# Patient Record
Sex: Female | Born: 1950 | Race: White
Health system: Midwestern US, Community
[De-identification: ages and names within clinical notes are randomized; demographics above are authoritative.]

## PROBLEM LIST (undated history)

## (undated) DIAGNOSIS — I959 Hypotension, unspecified: Secondary | ICD-10-CM

## (undated) DIAGNOSIS — F329 Major depressive disorder, single episode, unspecified: Secondary | ICD-10-CM

## (undated) DIAGNOSIS — D649 Anemia, unspecified: Secondary | ICD-10-CM

## (undated) DIAGNOSIS — M419 Scoliosis, unspecified: Secondary | ICD-10-CM

## (undated) DIAGNOSIS — F32A Depression, unspecified: Secondary | ICD-10-CM

## (undated) DIAGNOSIS — W19XXXA Unspecified fall, initial encounter: Secondary | ICD-10-CM

## (undated) DIAGNOSIS — M545 Low back pain, unspecified: Secondary | ICD-10-CM

## (undated) DIAGNOSIS — Z5189 Encounter for other specified aftercare: Secondary | ICD-10-CM

## (undated) HISTORY — PX: OTHER SURGICAL HISTORY: SHX169

## (undated) HISTORY — PX: TUBAL LIGATION: SHX77

## (undated) HISTORY — PX: ABLATION ON ENDOMETRIOSIS: SHX5787

## (undated) HISTORY — DX: Encounter for other specified aftercare: Z51.89

## (undated) HISTORY — PX: COLONOSCOPY: SHX174

## (undated) HISTORY — DX: Anemia, unspecified: D64.9

## (undated) HISTORY — DX: Major depressive disorder, single episode, unspecified: F32.9

## (undated) HISTORY — DX: Depression, unspecified: F32.A

## (undated) HISTORY — DX: Hypotension, unspecified: I95.9

## (undated) HISTORY — PX: WISDOM TOOTH EXTRACTION: SHX21

---

## 2001-09-27 LAB — HM DEXA SCAN: HM Dexa Scan: NORMAL

## 2003-08-28 LAB — HM COLONOSCOPY

## 2003-09-26 ENCOUNTER — Ambulatory Visit (HOSPITAL_COMMUNITY): Admission: RE | Admit: 2003-09-26 | Discharge: 2003-09-26 | Payer: Self-pay | Admitting: Gastroenterology

## 2004-06-24 ENCOUNTER — Other Ambulatory Visit: Payer: Self-pay

## 2006-02-03 ENCOUNTER — Ambulatory Visit: Payer: Self-pay | Admitting: Family Medicine

## 2009-01-17 ENCOUNTER — Ambulatory Visit: Payer: Self-pay | Admitting: Family Medicine

## 2009-01-17 DIAGNOSIS — R42 Dizziness and giddiness: Secondary | ICD-10-CM | POA: Insufficient documentation

## 2009-03-12 ENCOUNTER — Telehealth: Payer: Self-pay | Admitting: Family Medicine

## 2011-03-14 ENCOUNTER — Other Ambulatory Visit: Payer: Self-pay | Admitting: Family Medicine

## 2011-03-14 MED ORDER — CIPROFLOXACIN HCL 500 MG PO TABS: 500.0000 mg | ORAL_TABLET | Freq: Two times a day (BID) | ORAL | Status: AC

## 2011-08-06 ENCOUNTER — Ambulatory Visit (INDEPENDENT_AMBULATORY_CARE_PROVIDER_SITE_OTHER): Payer: PRIVATE HEALTH INSURANCE | Admitting: Family Medicine

## 2011-08-06 ENCOUNTER — Encounter: Payer: Self-pay | Admitting: Family Medicine

## 2011-08-06 VITALS — BP 102/70 | HR 81 | Temp 98.2°F | Wt 134.2 lb

## 2011-08-06 DIAGNOSIS — J069 Acute upper respiratory infection, unspecified: Secondary | ICD-10-CM

## 2011-08-06 DIAGNOSIS — J029 Acute pharyngitis, unspecified: Secondary | ICD-10-CM

## 2011-08-06 LAB — POCT RAPID STREP A (OFFICE): Rapid Strep A Screen: NEGATIVE

## 2011-08-06 MED ORDER — AZITHROMYCIN 250 MG PO TABS: ORAL_TABLET | ORAL | Status: AC

## 2011-08-06 NOTE — Patient Instructions (Addendum)
Drink plenty of fluids, take tylenol/dayquil as needed, and gargle with warm salt water for your throat.  This should gradually improve.  Take care.  Let us know if you have other concerns.  I think the cough will get better last.  Start the antibiotics if you are aren't better next week.

## 2011-08-06 NOTE — Progress Notes (Signed)
duration of symptoms: 2 weeks ago, was starting to get better after a few days. Had lingering cough.  Worse again since last night.  New/recent sx: Rhinorrhea: yes Congestion:yes ear pain:yes, B sore throat: yes Cough:same as prev Myalgias: yes No sick contacts  ROS: See HPI.  Otherwise negative.    Meds, vitals, and allergies reviewed.   GEN: nad, alert and oriented HEENT: mucous membranes moist, TM w/o erythema, nasal epithelium injected, OP with cobblestoning, op with erythema, max sinus ttp B NECK: supple w/o LA CV: rrr. PULM: ctab, no inc wob ABD: soft, +bs EXT: no edema RST neg

## 2011-08-08 ENCOUNTER — Encounter: Payer: Self-pay | Admitting: Family Medicine

## 2011-08-08 DIAGNOSIS — J069 Acute upper respiratory infection, unspecified: Secondary | ICD-10-CM | POA: Insufficient documentation

## 2011-08-08 NOTE — Assessment & Plan Note (Signed)
Nontoxic, likely with prev viral/resolving illness that had prolonged cough associated with it.  Now with second illness.  I would give this a few days to resolve and if not improved, start abx at that point given her exam.  She agreed.  ddx d/w pt.  Supportive tx in meantime.

## 2012-01-10 ENCOUNTER — Ambulatory Visit: Payer: PRIVATE HEALTH INSURANCE | Admitting: Family Medicine

## 2012-01-14 ENCOUNTER — Ambulatory Visit (INDEPENDENT_AMBULATORY_CARE_PROVIDER_SITE_OTHER): Payer: PRIVATE HEALTH INSURANCE | Admitting: Family Medicine

## 2012-01-14 ENCOUNTER — Encounter: Payer: Self-pay | Admitting: Family Medicine

## 2012-01-14 VITALS — BP 110/64 | HR 86 | Temp 97.8°F | Ht 66.0 in | Wt 130.0 lb

## 2012-01-14 DIAGNOSIS — R490 Dysphonia: Secondary | ICD-10-CM

## 2012-01-14 DIAGNOSIS — H9209 Otalgia, unspecified ear: Secondary | ICD-10-CM

## 2012-01-14 DIAGNOSIS — R42 Dizziness and giddiness: Secondary | ICD-10-CM

## 2012-01-14 NOTE — Assessment & Plan Note (Signed)
Nl exam  Some vertigo at night  Will try antihistamine to see if it helps  Ref to ENT

## 2012-01-14 NOTE — Patient Instructions (Signed)
You can try an antihistamine daily like zyrtec or claritin We will refer you to ENT at check out  Update if suddenly worse or fever or other symptoms

## 2012-01-14 NOTE — Progress Notes (Signed)
Subjective:    Patient ID: Christie Lin, female    DOB: September 23, 1951, 61 y.o.   MRN: [id number]  HPI Is here for hoarseness - is intermittent - not constant  Also her L ear hurts occasionally  Also some dizziness at night - rolling over in bed   ? 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 VERTIGO  . URI (upper respiratory infection)  . Hoarseness  . Ear discomfort   Past Medical History  Diagnosis Date  . Endometriosis   . Depression     grief reaction   No past surgical history on file. History  Substance Use Topics  . Smoking status: Never Smoker   . Smokeless tobacco: Not on file  . Alcohol Use: Not on file   Family History  Problem Relation Age of Onset  . Depression Sister   . Cancer Maternal Grandmother     ovarian   Allergies  Allergen Reactions  . Penicillins    Current Outpatient Prescriptions on File Prior to Visit  Medication Sig Dispense Refill  . sertraline (ZOLOFT) 25 MG tablet Take 25 mg by mouth every other day.          Review of Systems Review of Systems  Constitutional: Negative for fever, appetite change, fatigue and unexpected weight change.  Eyes: Negative for pain and visual disturbance.  ENT pos for ear discomfort and hoarse voice without st or congestion  Respiratory: Negative for cough and shortness of breath.   Cardiovascular: Negative for cp or palpitations    Gastrointestinal: Negative for nausea, diarrhea and constipation.  Genitourinary: Negative for urgency and frequency.  Skin: Negative for pallor or rash   Neurological: Negative for weakness, , numbness and headaches. pos for  dizziness Hematological: Negative for adenopathy. Does not bruise/bleed easily.  Psychiatric/Behavioral: Negative for dysphoric mood. The patient is not nervous/anxious.          Objective:   Physical Exam  Constitutional: She appears well-developed and well-nourished. No distress.  HENT:  Head: Normocephalic and atraumatic.  Right Ear: External ear normal.  Left Ear: External ear normal.  Nose: Nose normal.  Mouth/Throat: Oropharynx is clear and moist. No oropharyngeal exudate.       No sinus tenderness Hearing nl   Neck: Normal range of motion. Neck supple. No JVD present. Carotid bruit is not present. No tracheal deviation present. No thyromegaly present.  Cardiovascular: Normal rate, regular rhythm, normal heart sounds and intact distal pulses.  Exam reveals no gallop.   Pulmonary/Chest: Effort normal and breath sounds normal. No respiratory distress. She has no wheezes.  Abdominal: Soft. Bowel sounds are normal. She exhibits no abdominal bruit. There is no tenderness.  Lymphadenopathy:    She has no cervical adenopathy.  Neurological: She is alert. She has normal reflexes. No cranial nerve deficit. She exhibits normal muscle tone. Coordination normal.  Skin: Skin is warm and dry. No rash noted. No erythema. No pallor.  Psychiatric: She has a normal mood and affect.  Assessment & Plan:

## 2012-01-14 NOTE — Assessment & Plan Note (Signed)
Worse lately- esp at night  Assoc with L ear discomfort  Not a lot of allergy symptoms Ref to ENT for eval

## 2012-01-14 NOTE — Assessment & Plan Note (Signed)
Intermittent since dec  No congestion or gerd symptoms Some ear discomfort Will ref to ENT for further eval

## 2012-04-20 ENCOUNTER — Ambulatory Visit (INDEPENDENT_AMBULATORY_CARE_PROVIDER_SITE_OTHER): Payer: PRIVATE HEALTH INSURANCE | Admitting: Family Medicine

## 2012-04-20 ENCOUNTER — Encounter: Payer: Self-pay | Admitting: Family Medicine

## 2012-04-20 ENCOUNTER — Telehealth: Payer: Self-pay | Admitting: Family Medicine

## 2012-04-20 VITALS — BP 113/78 | HR 63 | Temp 97.9°F | Ht 66.0 in | Wt 123.5 lb

## 2012-04-20 DIAGNOSIS — R55 Syncope and collapse: Secondary | ICD-10-CM

## 2012-04-20 NOTE — Progress Notes (Signed)
Nature conservation officer at [facility] [address] [place] [address] Phone: [phone] Fax: [phone]  Date:  04/20/2012   Name:  Christie Lin   DOB:  1951-02-04   MRN:  [id number]  PCP:  Roxy Manns, MD    Chief Complaint: Loss of Consciousness   History of Present Illness:  Christie Lin is a 62 y.o. very pleasant female patient who presents with the following:  Was standing in the parking lot, then not feeling really well. Changed her leg position and changed her leg position and leaned against the car, then felt nauseous. Felt like going to pass out. Police came and caught as fell. Did not fully black out. History is significant for not eating well, and losing 10 pounds during her recent trip to [place].  Twice in a half hour. About 1:30.   Wt Readings from Last 3 Encounters:  04/20/12 123 lb 8 oz (56.019 kg)  01/14/12 130 lb 0.6 oz (58.986 kg)  08/06/11 134 lb 4 oz (60.895 kg)   Took some ABX in [place].   Last week. Wants to sleep. Ate while in [place], but not eaten since back.  Sometimes will have ? Palpitations.   Took some chinese herbal medicine while there while sick. A lot of herbs. Last week. 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   Had a cup of cofee this morning, then a bagel, then pass out. And a handful of almonds.  Yesterday, had two pimento cheese sandwiches, peach, yogurt.     Past Medical History, Surgical History, Social History, Family History, Problem List, Medications, and Allergies have been reviewed and updated if relevant.  Current Outpatient Prescriptions on File Prior to Visit  Medication Sig Dispense Refill  . sertraline (ZOLOFT) 25 MG tablet Take 25 mg by mouth every other day.        Review of Systems: As above. No fever chills or sweats. No true syncope. Presyncopal event x2. No shortness of breath  Physical Examination: Filed Vitals:   04/20/12 1553  BP: 110/68  Pulse: 63  Temp: 97.9 F (36.6 C)   Filed Vitals:   04/20/12 1553  Height: 5\' 6"  (1.676 m)  Weight: 123 lb 8 oz (56.019 kg)   Body mass index is 19.93 kg/(m^2). Ideal Body Weight: Weight in (lb) to have BMI = 25: 154.6    GEN: WDWN, NAD, Non-toxic, A & O x 3 HEENT: Atraumatic, Normocephalic. Neck supple. No masses, No LAD. Ears and Nose: No external deformity. CV: RRR, No M/G/R. No JVD. No thrill. No extra heart sounds. PULM: CTA B, no wheezes, crackles, rhonchi. No retractions. No resp. distress. No accessory muscle use. EXTR: No c/c/e NEURO Normal gait.  PSYCH: Normally interactive. Conversant. Not depressed or anxious appearing.  Calm demeanor.    Assessment and Plan:  1. Fainting  EKG 12-Lead, Basic metabolic panel, CBC with Differential, TSH, Hepatic function panel   EKG: Normal sinus rhythm. Normal axis, normal R wave progression, No acute ST elevation or depression.  The patient's blood pressure dropped significantly, and was actually unable to be registered when she was initially laid flat. I strongly suspect that this is orthostatic or vasovagal, and likely suspect that her recent 10 pound weight loss and probable dehydration was playing a role. I suggested that they make sure that she eat 3 meals a day along with some snacking with some additional salt or Gatorade to help with volume. If symptoms continue, further investigation is warranted.  Orders Today:  Orders Placed This Encounter  Procedures  . Basic metabolic panel  . CBC with Differential  . TSH  . Hepatic function panel  . EKG 12-Lead    Medications Today: (Includes new updates added during medication reconciliation) No orders of the defined types were placed in this encounter.     Results for orders placed in visit on 04/20/12  BASIC METABOLIC PANEL      Component Value Range   Sodium 140  135 - 145 mEq/L   Potassium 4.8  3.5 - 5.1 mEq/L   Chloride 103  96 - 112 mEq/L   CO2 28  19 - 32 mEq/L   Glucose, Bld 94  70 - 99 mg/dL   BUN 17  6 - 23 mg/dL    Creatinine, Ser 0.9  0.4 - 1.2 mg/dL   Calcium 9.9  8.4 - 02.7 mg/dL   GFR 25.36  >64.40 mL/min  CBC WITH DIFFERENTIAL      Component Value Range   WBC 5.9  4.5 - 10.5 K/uL   RBC 4.57  3.87 - 5.11 Mil/uL   Hemoglobin 14.2  12.0 - 15.0 g/dL   HCT 34.7  42.5 - 95.6 %   MCV 92.4  78.0 - 100.0 fl   MCHC 33.7  30.0 - 36.0 g/dL   RDW 38.7  56.4 - 33.2 %   Platelets 214.0  150.0 - 400.0 K/uL   Neutrophils Relative 68.5  43.0 - 77.0 %   Lymphocytes Relative 23.9  12.0 - 46.0 %   Monocytes Relative 5.8  3.0 - 12.0 %   Eosinophils Relative 1.6  0.0 - 5.0 %   Basophils Relative 0.2  0.0 - 3.0 %   Neutro Abs 4.0  1.4 - 7.7 K/uL   Lymphs Abs 1.4  0.7 - 4.0 K/uL   Monocytes Absolute 0.3  0.1 - 1.0 K/uL   Eosinophils Absolute 0.1  0.0 - 0.7 K/uL   Basophils Absolute 0.0  0.0 - 0.1 K/uL  TSH      Component Value Range   TSH 0.46  0.35 - 5.50 uIU/mL  HEPATIC FUNCTION PANEL      Component Value Range   Total Bilirubin 0.6  0.3 - 1.2 mg/dL   Bilirubin, Direct 0.1  0.0 - 0.3 mg/dL   Alkaline Phosphatase 52  39 - 117 U/L   AST 22  0 - 37 U/L   ALT 14  0 - 35 U/L   Total Protein 7.5  6.0 - 8.3 g/dL   Albumin 4.4  3.5 - 5.2 g/dL     Hannah Beat, MD

## 2012-04-20 NOTE — Telephone Encounter (Signed)
Caller: Jennifer/Child; PCP: Tower, Marne A.; CB#: [phone]; ; ; Call regarding Syncope; x 2 while standing in a parking lot.  Pt.s fall was broken by a bystander.  She stood up, and felt dizzy again, and fell a second time, and a bystander broke her fall again.  Pt. was on a 28 hour plane ride back from [place] yesterday.  Pt. is presently A/O x 3 with no neuro deficits.  Triaged with Fainting and emergent sx. r/o with exception to:  Fainting sx. without any warning sx.  Disposition:  See Provider within 4 hours.  Appt. made for 3:30 pm today.  Care instructions given.  CAN/DB

## 2012-04-21 LAB — CBC WITH DIFFERENTIAL/PLATELET
Basophils Absolute: 0 10*3/uL (ref 0.0–0.1)
Basophils Relative: 0.2 % (ref 0.0–3.0)
Eosinophils Absolute: 0.1 10*3/uL (ref 0.0–0.7)
Eosinophils Relative: 1.6 % (ref 0.0–5.0)
HCT: 42.2 % (ref 36.0–46.0)
Hemoglobin: 14.2 g/dL (ref 12.0–15.0)
Lymphocytes Relative: 23.9 % (ref 12.0–46.0)
Lymphs Abs: 1.4 10*3/uL (ref 0.7–4.0)
MCHC: 33.7 g/dL (ref 30.0–36.0)
MCV: 92.4 fl (ref 78.0–100.0)
Monocytes Absolute: 0.3 10*3/uL (ref 0.1–1.0)
Monocytes Relative: 5.8 % (ref 3.0–12.0)
Neutro Abs: 4 10*3/uL (ref 1.4–7.7)
Neutrophils Relative %: 68.5 % (ref 43.0–77.0)
Platelets: 214 10*3/uL (ref 150.0–400.0)
RBC: 4.57 Mil/uL (ref 3.87–5.11)
RDW: 13.5 % (ref 11.5–14.6)
WBC: 5.9 10*3/uL (ref 4.5–10.5)

## 2012-04-21 LAB — BASIC METABOLIC PANEL
BUN: 17 mg/dL (ref 6–23)
CO2: 28 mEq/L (ref 19–32)
Calcium: 9.9 mg/dL (ref 8.4–10.5)
Chloride: 103 mEq/L (ref 96–112)
Creatinine, Ser: 0.9 mg/dL (ref 0.4–1.2)
GFR: 67.9 mL/min (ref >60.00)
Glucose, Bld: 94 mg/dL (ref 70–99)
Potassium: 4.8 mEq/L (ref 3.5–5.1)
Sodium: 140 mEq/L (ref 135–145)

## 2012-04-21 LAB — HEPATIC FUNCTION PANEL
ALT: 14 U/L (ref 0–35)
AST: 22 U/L (ref 0–37)
Albumin: 4.4 g/dL (ref 3.5–5.2)
Alkaline Phosphatase: 52 U/L (ref 39–117)
Bilirubin, Direct: 0.1 mg/dL (ref 0.0–0.3)
Total Bilirubin: 0.6 mg/dL (ref 0.3–1.2)
Total Protein: 7.5 g/dL (ref 6.0–8.3)

## 2012-04-21 LAB — TSH: TSH: 0.46 u[IU]/mL (ref 0.35–5.50)

## 2012-04-24 ENCOUNTER — Encounter: Payer: Self-pay | Admitting: *Deleted

## 2012-06-28 ENCOUNTER — Telehealth: Payer: Self-pay | Admitting: Family Medicine

## 2012-06-28 NOTE — Telephone Encounter (Signed)
Advise pt that Dr. Karie Schwalbe recommends to go to urgent care to be evaluated and pt agreed and said she is going to go to an urgent care

## 2012-06-28 NOTE — Telephone Encounter (Signed)
Since I am getting this after 5 pm - I think she needs to go to an urgent care to be seen now

## 2012-06-28 NOTE — Telephone Encounter (Signed)
°  Caller: [name]/Patient; Patient Name: Christie Lin; PCP: Roxy Manns [facility]); Best Callback Phone Number: [phone].  She has UTI symptoms burning, frequency and starting to see "pink"  on the tissue.  Symptoms started 06/27/12.  Triaged Bloody Urine and Pt passed a what looked like a long "string of Lin".  Needs to be seen in 4 hours for passing Lin clots with urination.   No appts seen in [facility].  Home care and call back instructions given.  Please call pateint to advise.

## 2012-09-08 ENCOUNTER — Ambulatory Visit (INDEPENDENT_AMBULATORY_CARE_PROVIDER_SITE_OTHER): Payer: PRIVATE HEALTH INSURANCE | Admitting: Family Medicine

## 2012-09-08 ENCOUNTER — Encounter: Payer: Self-pay | Admitting: Family Medicine

## 2012-09-08 VITALS — BP 112/70 | HR 69 | Temp 98.0°F | Wt 129.0 lb

## 2012-09-08 DIAGNOSIS — S239XXA Sprain of unspecified parts of thorax, initial encounter: Secondary | ICD-10-CM | POA: Insufficient documentation

## 2012-09-08 MED ORDER — NAPROXEN 500 MG PO TABS: ORAL_TABLET | ORAL | Status: DC

## 2012-09-08 MED ORDER — CYCLOBENZAPRINE HCL 5 MG PO TABS: 5.0000 mg | ORAL_TABLET | Freq: Three times a day (TID) | ORAL | Status: DC | PRN

## 2012-09-08 NOTE — Progress Notes (Signed)
  Subjective:    Patient ID: Christie Lin, female    DOB: 25-Jan-1951, 61 y.o.   MRN: [id number]  HPI CC: "my back has been hurting for 6-8 wks"  6-8 wk h/o lower thoracic back pain that radiates into ribcage.  L>R.  Getting worse.  Now with muscle spasms that take breath away.  Yesterday pain started radiating to L hip.  Has ben carrying 2 grandchildren (43mo and 3yo) recently (not more than normal).  Last few days with mild nausea.  Denies inciting trauma/falls/injury.    Today took 2 aspirin and 3 advil.  Has to leave when shopping 2/2 back pain.  Denies neck pain, lower back pain.  No shooting pain down legs.  No parethesias down legs.  No bowel/bladder accidents.  No fevers/chills.   Denies dysuria, frequency.  Chronic urgency.  No blood in stool or urine.  No weight changes.  No abd pain. [employer] broadcasting/film/video.  Past Medical History  Diagnosis Date  . Endometriosis   . Depression     grief reaction    Review of Systems Per HPI    Objective:   Physical Exam  Nursing note and vitals reviewed. Constitutional: She appears well-developed and well-nourished. No distress.  Musculoskeletal:       No significant scoliosis No midline thoracic or lumbar spine tenderness + thoracic paraspinous mm tightness evident on left but not significantly tender. Neg SLR bilaterally Neg FABER test No pain at SIJ, GTB or sciatic notch bilaterally FROM at shoulders and neck.  Neurological: She has normal strength. No sensory deficit. She displays a negative Romberg sign.       5/5 BLE       Assessment & Plan:

## 2012-09-08 NOTE — Patient Instructions (Signed)
You have thoracic strain. Treat with flexeril 5mg  2-3 times daily as needed - may make you sleepy - and take naprosyn twice daily with food for 5 days then as needed. Do stretching exercises provided today. If not improving as expected, or any worsening, let us know.

## 2012-09-08 NOTE — Assessment & Plan Note (Signed)
No red flags. Treat with NSAID and flexeril scheduled for 3-5 days.  Discussed sedation precautions with flexeril, sent in lower dose. rec do stretching exercises provided from [facility] pt advisor on upper back pain. Pt agrees with plan.

## 2012-09-25 ENCOUNTER — Encounter: Payer: Self-pay | Admitting: Family Medicine

## 2012-09-25 ENCOUNTER — Ambulatory Visit (INDEPENDENT_AMBULATORY_CARE_PROVIDER_SITE_OTHER): Payer: PRIVATE HEALTH INSURANCE | Admitting: Family Medicine

## 2012-09-25 VITALS — BP 100/70 | HR 82 | Temp 98.4°F | Ht 66.0 in | Wt 133.8 lb

## 2012-09-25 DIAGNOSIS — I959 Hypotension, unspecified: Secondary | ICD-10-CM | POA: Insufficient documentation

## 2012-09-25 DIAGNOSIS — R55 Syncope and collapse: Secondary | ICD-10-CM | POA: Insufficient documentation

## 2012-09-25 NOTE — Progress Notes (Signed)
Subjective:    Patient ID: Christie Lin, female    DOB: [birth year], 61 y.o.   MRN: [id number]  HPI Here with issues re: hypotension  Was here in July - with syncope times 2 -- thought to be vasovagal  Has happened 6 more times Tends to have low bp Friday- felt episode coming on -- was getting coffee in am -- and then husb helped her into a chair- she fell to floor and went limp No seizure activity but was sweating a lot  Had to lie on the floor Left her so weak- had to go to bed for 2-3 hours  Took her bp 79/48  Very pale  Pulse was 96   Last labs were all ok as well , and EKG nl  Is good about getting enough salt and water in diet  Not an exerciser regularly   No one in family with these symptoms   No new medicines  Is on zoloft  2 weeks ago came in for back pain  If she takes flexeril is in the eve   Patient Active Problem List  Diagnosis  . VERTIGO  . Thoracic sprain   Past Medical History  Diagnosis Date  . Endometriosis   . Depression     grief reaction   No past surgical history on file. History  Substance Use Topics  . Smoking status: Never Smoker   . Smokeless tobacco: Not on file  . Alcohol Use: No   Family History  Problem Relation Age of Onset  . Depression Sister   . Cancer Maternal Grandmother     ovarian   Allergies  Allergen Reactions  . Penicillins    Current Outpatient Prescriptions on File Prior to Visit  Medication Sig Dispense Refill  . cyclobenzaprine (FLEXERIL) 5 MG tablet Take 1 tablet (5 mg total) by mouth 3 (three) times daily as needed for muscle spasms.  40 tablet  1  . naproxen (NAPROSYN) 500 MG tablet Take one po bid x 1 week then prn pain, take with food  60 tablet  0  . sertraline (ZOLOFT) 25 MG tablet Take 25 mg by mouth every other day.           Review of Systems Review of Systems  Constitutional: Negative for fever, appetite change,  and unexpected weight change. pos for fatigue after syncopal episode Eyes:  Negative for pain and visual disturbance.  Respiratory: Negative for cough and shortness of breath.   Cardiovascular: Negative for cp or palpitations    Gastrointestinal: Negative for nausea, diarrhea and constipation.  Genitourinary: Negative for urgency and frequency.  Skin: Negative for pallor or rash   Neurological: Negative for weakness, light-headedness, numbness and headaches. pos for syncope , neg for seizure activity Hematological: Negative for adenopathy. Does not bruise/bleed easily.  Psychiatric/Behavioral: Negative for dysphoric mood. The patient is not nervous/anxious.         Objective:   Physical Exam  Constitutional: She is oriented to person, place, and time. She appears well-developed and well-nourished. No distress.  HENT:  Head: Normocephalic and atraumatic.  Right Ear: External ear normal.  Left Ear: External ear normal.  Mouth/Throat: Oropharynx is clear and moist.  Eyes: Conjunctivae normal and EOM are normal. Pupils are equal, round, and reactive to light. Right eye exhibits no discharge. Left eye exhibits no discharge. No scleral icterus.  Neck: Normal range of motion. Neck supple. No JVD present. Carotid bruit is not present. No tracheal deviation present. No thyromegaly present.  Cardiovascular: Normal rate, regular rhythm, normal heart sounds and intact distal pulses.  Exam reveals no gallop.   Pulmonary/Chest: Breath sounds normal. No respiratory distress. She has no wheezes. She has no rales.  Abdominal: Soft. Bowel sounds are normal. She exhibits no distension, no abdominal bruit and no mass. There is no tenderness.  Musculoskeletal: She exhibits no edema and no tenderness.  Lymphadenopathy:    She has no cervical adenopathy.  Neurological: She is alert and oriented to person, place, and time. She has normal reflexes. She displays no atrophy and no tremor. No cranial nerve deficit or sensory deficit. She exhibits normal muscle tone. She displays no seizure  activity. Coordination and gait normal.       No focal cerebellar signs  Skin: Skin is warm and dry. No rash noted. No erythema. No pallor.  Psychiatric: She has a normal mood and affect.          Assessment & Plan:

## 2012-09-25 NOTE — Patient Instructions (Addendum)
Continue trying to get enough fluids and salt and do not skip meals If you feel faint- always lie down - as you have been doing We will refer you to cardiology at check out  Alert me if any new symptoms in the meantime

## 2012-09-25 NOTE — Assessment & Plan Note (Signed)
Recurrent with vasovagal symptoms and likely orthostatic hypotension Rev lab and EKG from summer  Ref to cardiology for further eval Disc safety She will continue eating salt and drinking fluids

## 2012-09-25 NOTE — Assessment & Plan Note (Signed)
See assessment for syncope Ref to cardiol

## 2012-10-12 ENCOUNTER — Ambulatory Visit (INDEPENDENT_AMBULATORY_CARE_PROVIDER_SITE_OTHER): Payer: PRIVATE HEALTH INSURANCE | Admitting: Cardiovascular Disease

## 2012-10-12 ENCOUNTER — Encounter: Payer: Self-pay | Admitting: Cardiovascular Disease

## 2012-10-12 VITALS — BP 102/68 | HR 98 | Ht 66.0 in | Wt 128.5 lb

## 2012-10-12 DIAGNOSIS — R55 Syncope and collapse: Secondary | ICD-10-CM | POA: Insufficient documentation

## 2012-10-12 DIAGNOSIS — R000 Tachycardia, unspecified: Secondary | ICD-10-CM

## 2012-10-12 DIAGNOSIS — R42 Dizziness and giddiness: Secondary | ICD-10-CM

## 2012-10-12 MED ORDER — FLUDROCORTISONE ACETATE 0.1 MG PO TABS: 0.1000 mg | ORAL_TABLET | Freq: Every day | ORAL | Status: DC

## 2012-10-12 MED ORDER — MIDODRINE HCL 5 MG PO TABS: 5.0000 mg | ORAL_TABLET | Freq: Three times a day (TID) | ORAL | Status: DC | PRN

## 2012-10-12 NOTE — Progress Notes (Signed)
Patient ID: Christie Lin, female    DOB: [birth year], 62 y.o.   MRN: [id number]  HPI Comments: Christie Lin is a very pleasant 62-year-old woman, patient of Dr. Milinda Antis who presents by referral for evaluation of near syncope he.  Reports that she went to [place] over the summer, returned in July 2013. Following her return, she felt unwell, and significant jet lag. 10 days later, she had her first episode of dizziness when standing. She felt weak, extremely dizzy. She had to lie down on the ground and felt "out of it". Since then she has had frequent episodes of near syncope. She had 2 on that day, numerous subsequent episodes. Documentation of each episode shows low blood pressure sometimes in the 70s over 40s with normal heart rate in the 80s to 90s. Last episode was several days ago, also episode on January 3. Now episodes are happening once a week.  Lab work done in July on her first episode was essentially normal. No dehydration. She did lose 10 pounds while she was in [place]. She has gained only several pounds back. She likes the way she feels and does not want to have to gain significant weight back if not needed. She is otherwise very active with no complaints of shortness of breath, chest pain, edema. Symptoms of lightheadedness dizziness, near syncope always happen when she is standing, sometimes for prolonged period of time.  EKG shows normal sinus rhythm with rate 80 beats per minute with no significant ST or T wave changes    Outpatient Encounter Prescriptions as of 10/12/2012  Medication Sig Dispense Refill  . citalopram (CELEXA) 20 MG tablet Take 20 mg by mouth daily.      . cyclobenzaprine (FLEXERIL) 5 MG tablet Take 1 tablet (5 mg total) by mouth 3 (three) times daily as needed for muscle spasms.  40 tablet  1  . naproxen (NAPROSYN) 500 MG tablet Take one po bid x 1 week then prn pain, take with food  60 tablet  0    Review of Systems  Constitutional: Negative.   HENT: Negative.     Eyes: Negative.   Respiratory: Negative.   Cardiovascular: Negative.   Gastrointestinal: Negative.   Musculoskeletal: Negative.   Skin: Negative.   Neurological: Positive for dizziness.       Near syncope with no loss of consciousness  Hematological: Negative.   Psychiatric/Behavioral: Negative.   All other systems reviewed and are negative.     BP 102/68  Pulse 98  Ht 5\' 6"  (1.676 m)  Wt 128 lb 8 oz (58.287 kg)  BMI 20.74 kg/m2  Physical Exam  Nursing note and vitals reviewed. Constitutional: She is oriented to person, place, and time. She appears well-developed.       Very thin  HENT:  Head: Normocephalic.  Nose: Nose normal.  Mouth/Throat: Oropharynx is clear and moist.  Eyes: Conjunctivae normal are normal. Pupils are equal, round, and reactive to light.  Neck: Normal range of motion. Neck supple. No JVD present.  Cardiovascular: Normal rate, regular rhythm, S1 normal, S2 normal, normal heart sounds and intact distal pulses.  Exam reveals no gallop and no friction rub.   No murmur heard. Pulmonary/Chest: Effort normal and breath sounds normal. No respiratory distress. She has no wheezes. She has no rales. She exhibits no tenderness.  Abdominal: Soft. Bowel sounds are normal. She exhibits no distension. There is no tenderness.  Musculoskeletal: Normal range of motion. She exhibits no edema and no tenderness.  Lymphadenopathy:  She has no cervical adenopathy.  Neurological: She is alert and oriented to person, place, and time. Coordination normal.  Skin: Skin is warm and dry. No rash noted. No erythema.  Psychiatric: She has a normal mood and affect. Her behavior is normal. Judgment and thought content normal.         Assessment and Plan

## 2012-10-12 NOTE — Patient Instructions (Addendum)
Please increase salt intake and fluid intake Consider starting the florinef 0.1 mg every other day Use midodrine as needed to start for symptomatic dizziness  Consider using compression hose  Please call us if you have new issues that need to be addressed before your next appt.

## 2012-10-12 NOTE — Assessment & Plan Note (Signed)
Numerous episodes of near syncope, lightheadedness, dizziness with orthostatic hypotension, documented low blood pressures. Heart rate is typically in the 80s to 90s. Does not seem to be a POTS phenomenon. More likely very low body weight contributing to baseline low blood pressure with periodic drops in her blood pressure when standing.  We have encouraged significant fluid intake, salt intake, possibly wearing compression TED hose. She should lie flat on the ground when she has lightheaded symptoms to avoid injury. We have strongly encouraged she gained several pounds of weight as this would help her blood pressure.   We have prescribed Florinef which she can take 0.1 mg 3 times a week up to every day for symptoms if they persist. Midodrine was also prescribed which she could start taking as needed for symptoms, possibly on a more regular basis daily (up to 3 times a day) as needed if symptoms persist. He would be okay to use both medications. Again I think the best thing would be weight gain which would likely improve her symptoms and minimize the need for medications.  No indication of arrhythmia, clinical exam is essentially benign with no signs of heart failure or cardiomyopathy. If unable to improve her symptoms or if they get worse, would certainly perform an echocardiogram.  If there is any indication of bradycardia or tachycardia during these episodes, event monitor would be indicated. She denies any significant arrhythmia on recent episodes.

## 2013-08-13 ENCOUNTER — Encounter: Payer: Self-pay | Admitting: Family Medicine

## 2013-08-13 ENCOUNTER — Ambulatory Visit (INDEPENDENT_AMBULATORY_CARE_PROVIDER_SITE_OTHER)
Admission: RE | Admit: 2013-08-13 | Discharge: 2013-08-13 | Disposition: A | Discharge status: Home / Self Care | Payer: PRIVATE HEALTH INSURANCE | Source: Ambulatory Visit | Attending: Family Medicine | Admitting: Family Medicine

## 2013-08-13 ENCOUNTER — Ambulatory Visit: Payer: Self-pay | Admitting: Family Medicine

## 2013-08-13 ENCOUNTER — Telehealth: Payer: Self-pay

## 2013-08-13 ENCOUNTER — Ambulatory Visit (INDEPENDENT_AMBULATORY_CARE_PROVIDER_SITE_OTHER): Payer: PRIVATE HEALTH INSURANCE | Admitting: Family Medicine

## 2013-08-13 VITALS — BP 126/62 | HR 66 | Temp 98.4°F | Ht 66.0 in | Wt 130.5 lb

## 2013-08-13 DIAGNOSIS — M25569 Pain in unspecified knee: Secondary | ICD-10-CM

## 2013-08-13 DIAGNOSIS — M546 Pain in thoracic spine: Secondary | ICD-10-CM

## 2013-08-13 DIAGNOSIS — M25561 Pain in right knee: Secondary | ICD-10-CM | POA: Insufficient documentation

## 2013-08-13 DIAGNOSIS — M549 Dorsalgia, unspecified: Secondary | ICD-10-CM | POA: Insufficient documentation

## 2013-08-13 DIAGNOSIS — M7120 Synovial cyst of popliteal space [Baker], unspecified knee: Secondary | ICD-10-CM | POA: Insufficient documentation

## 2013-08-13 MED ORDER — CYCLOBENZAPRINE HCL 5 MG PO TABS: 5.0000 mg | ORAL_TABLET | Freq: Three times a day (TID) | ORAL | Status: DC | PRN

## 2013-08-13 NOTE — Telephone Encounter (Signed)
Dr Milinda Antis said to tell pt it was a Baker's Cyst and pt can leave [place] and will be contacted later with further instruction. [name] voiced understanding and will let pt know.

## 2013-08-13 NOTE — Assessment & Plan Note (Signed)
Mostly posterior  ? Baker's cyst (though it is smaller than it was) Sent for venous duplex of knee Ice/elevation/ aleve prn

## 2013-08-13 NOTE — Progress Notes (Signed)
Subjective:    Patient ID: Christie Lin, female    DOB: 05-21-1951, 62 y.o.   MRN: [id number]  HPI Here for leg/ knee and back pain   Right knee- gets swollen behind it -- today it is a little better  Front of knee hurts a bit also  Deep hip pain occ on that side also -rad down to lower leg occ   Back pain and muscle spasms - mid back radiating to her shoulder blade on R  Has had this before --- had nsaid and muscle relaxer  This comes and goes -when it happens it is really bad  No comfortable position- she tends to want to bend forward- uses heat and cold   No new exercise or trauma   Patient Active Problem List   Diagnosis Date Noted  . Near syncope 10/12/2012  . Syncope 09/25/2012  . Hypotension 09/25/2012  . Thoracic sprain 09/08/2012  . VERTIGO 01/17/2009   Past Medical History  Diagnosis Date  . Endometriosis   . Depression     grief reaction   Past Surgical History  Procedure Laterality Date  . Ruptured ovarian cyst    . Ablation on endometriosis     History  Substance Use Topics  . Smoking status: Never Smoker   . Smokeless tobacco: Not on file  . Alcohol Use: No   Family History  Problem Relation Age of Onset  . Depression Sister   . Cancer Maternal Grandmother     ovarian   Allergies  Allergen Reactions  . Penicillins    Current Outpatient Prescriptions on File Prior to Visit  Medication Sig Dispense Refill  . citalopram (CELEXA) 20 MG tablet Take 20 mg by mouth daily.       No current facility-administered medications on file prior to visit.     Review of Systems Review of Systems  Constitutional: Negative for fever, appetite change, fatigue and unexpected weight change.  Eyes: Negative for pain and visual disturbance.  Respiratory: Negative for cough and shortness of breath.   Cardiovascular: Negative for cp or palpitations    Gastrointestinal: Negative for nausea, diarrhea and constipation.  Genitourinary: Negative for urgency and  frequency.  Skin: Negative for pallor or rash  MSK pos for hip pain and back pain and knee pain / neg for joint swelling   Neurological: Negative for weakness, light-headedness, numbness and headaches.  Hematological: Negative for adenopathy. Does not bruise/bleed easily.  Psychiatric/Behavioral: Negative for dysphoric mood. The patient is not nervous/anxious.         Objective:   Physical Exam  Constitutional: She appears well-developed and well-nourished. No distress.  HENT:  Head: Normocephalic and atraumatic.  Eyes: Conjunctivae and EOM are normal. Pupils are equal, round, and reactive to light.  Neck: Normal range of motion. Neck supple.  Cardiovascular: Normal rate and regular rhythm.   Musculoskeletal: She exhibits edema and tenderness.  TS slt thoracolumbar scoliosis to R with spasm in paraspinal musculature med to R scapula and tenderness Nl rom   Mild R greater trochanteric tenderness  R knee- slt tender over patellar tendon and post knee No joint line tenderness or crepitus or effusion  Nl rom -pain on full flexion Some fullness palp post to knee that is tender   Lymphadenopathy:    She has no cervical adenopathy.  Neurological: She is alert. She has normal reflexes. She displays no atrophy. No cranial nerve deficit or sensory deficit. She exhibits normal muscle tone. Coordination normal.  Skin: Skin is  warm and dry. No rash noted. No erythema.  Psychiatric: She has a normal mood and affect.          Assessment & Plan:

## 2013-08-13 NOTE — Patient Instructions (Signed)
Xray of back today  We will schedule ultrasound of leg at check out  Take 2 aleve with meal twice daily as needed for pain  Flexeril with caution as needed for back spasm

## 2013-08-13 NOTE — Telephone Encounter (Signed)
Pt notified of xray results and Dr. Tower's comments 

## 2013-08-13 NOTE — Telephone Encounter (Signed)
Please let her know when she gets home that this is a baker's cyst as we suspected - next step is ref to ortho to disc tx options-let me know if agreeable

## 2013-08-13 NOTE — Telephone Encounter (Signed)
[name], [name] called report rt leg [place]; negative for DVT; has Baker's Cyst popliteal fossa measuring 4.8x4.3x1.3 cm. Report sent and verbally delivered to Dr Milinda Antis.

## 2013-08-13 NOTE — Telephone Encounter (Signed)
No acute changes on back xray - some scoliosis like I thought - this can produce muscle spasms  Use aleve and flexeril as needed If symptoms worsen- I will refer her for some physical therapy  I will do the ortho referral now

## 2013-08-13 NOTE — Telephone Encounter (Signed)
Pt notified of [place] results, pt does agree with referral to Ortho, I advise [facility]/L[facility] will call to schedule appt, pt wanted to know if we have her xray results back too, please advise

## 2013-08-13 NOTE — Progress Notes (Signed)
Pre-visit discussion using our clinic review tool. No additional management support is needed unless otherwise documented below in the visit note.  

## 2013-08-13 NOTE — Assessment & Plan Note (Signed)
Recurrent Some mild scoliosis and spasm on exam  Xray today- fam hx of mult myeloma and also this is 2nd occurrence  Flexeril prn with caution Heat/ice  Aleve bid with food Will update

## 2013-10-11 ENCOUNTER — Telehealth: Payer: Self-pay

## 2013-10-11 NOTE — Telephone Encounter (Signed)
Pt left v/m requesting flu shot and whooping cough shot. Is it OK to schedule nurse visit?

## 2013-10-11 NOTE — Telephone Encounter (Signed)
appt scheduled for 10/18/13.

## 2013-10-11 NOTE — Telephone Encounter (Signed)
Yes- please check her chart and if she is due that is fine

## 2013-10-18 ENCOUNTER — Ambulatory Visit: Payer: PRIVATE HEALTH INSURANCE

## 2013-11-01 ENCOUNTER — Ambulatory Visit (INDEPENDENT_AMBULATORY_CARE_PROVIDER_SITE_OTHER): Payer: PRIVATE HEALTH INSURANCE | Admitting: *Deleted

## 2013-11-01 DIAGNOSIS — Z23 Encounter for immunization: Secondary | ICD-10-CM

## 2014-11-13 ENCOUNTER — Telehealth: Payer: Self-pay | Admitting: Family Medicine

## 2014-11-13 DIAGNOSIS — M25561 Pain in right knee: Secondary | ICD-10-CM

## 2014-11-13 NOTE — Telephone Encounter (Signed)
Pt saw ortho specialist about 1-1.5 years ago, and does not remember who it was.  Pt needs to see ortho again and wants to know if she needs an appointment for a referral or if we can do a referral without an appointment.  The best number to reach the pt is [phone]. Thanks.

## 2014-11-13 NOTE — Telephone Encounter (Signed)
Spoken to patient. She stated that she does not have insurance. She has a health saving account that she would be using. Patient stated that a couple years, Dr Glori Bickers referral her to ortho for painful Baker's cyst of the right knee. Patient stated that the right knee is painful lately, it hurts to put pressure on it. She would like a referral to ortho.

## 2014-11-13 NOTE — Telephone Encounter (Signed)
It's up to her insurance whether she needs a referral -she will have to check  What does she need to be seen for? Let me know if she does end up needing a referral, thanks

## 2014-11-13 NOTE — Telephone Encounter (Signed)
Called and spoken to patient. Notified her the referral is done and that she will get a call.

## 2014-11-13 NOTE — Telephone Encounter (Signed)
Please advise 

## 2014-11-13 NOTE — Telephone Encounter (Signed)
Referral done  Let her know she will get a call

## 2015-08-06 ENCOUNTER — Ambulatory Visit (INDEPENDENT_AMBULATORY_CARE_PROVIDER_SITE_OTHER): Payer: PRIVATE HEALTH INSURANCE | Admitting: Primary Care

## 2015-08-06 ENCOUNTER — Encounter: Payer: Self-pay | Admitting: Primary Care

## 2015-08-06 VITALS — BP 112/72 | HR 62 | Temp 97.7°F | Ht 66.0 in | Wt 135.4 lb

## 2015-08-06 DIAGNOSIS — R05 Cough: Secondary | ICD-10-CM

## 2015-08-06 DIAGNOSIS — R059 Cough, unspecified: Secondary | ICD-10-CM

## 2015-08-06 MED ORDER — AZITHROMYCIN 250 MG PO TABS: ORAL_TABLET | ORAL | Status: DC

## 2015-08-06 MED ORDER — HYDROCODONE-HOMATROPINE 5-1.5 MG/5ML PO SYRP: 5.0000 mL | ORAL_SOLUTION | Freq: Every evening | ORAL | Status: DC | PRN

## 2015-08-06 NOTE — Progress Notes (Signed)
Pre visit review using our clinic review tool, if applicable. No additional management support is needed unless otherwise documented below in the visit note. 

## 2015-08-06 NOTE — Progress Notes (Signed)
Subjective:    Patient ID: Christie Lin, female    DOB: October 11, 1950, 64 y.o.   MRN: [id number]  HPI  Christie Lin is a 64 year old female who presents today with a chief complaint of cough. She also reports symptoms of nasal congestion, chills, headache, fatigue. Her symptoms have been present for 10 days. Her cough is now productive. She is not sleeping at night. Overal she's had no improvement in her symptoms. She's taken tylenol and Nyquill with temporary relief.    Review of Systems  Constitutional: Positive for chills and fatigue.  HENT: Positive for congestion and sinus pressure.   Respiratory: Positive for cough. Negative for shortness of breath.   Cardiovascular: Negative for chest pain.  Musculoskeletal: Positive for myalgias.  Neurological: Positive for headaches.       Past Medical History  Diagnosis Date  . Endometriosis   . Depression     grief reaction    Social History   Social History  . Marital Status: Single    Spouse Name: N/A  . Number of Children: N/A  . Years of Education: N/A   Occupational History  . Teacher    Social History Main Topics  . Smoking status: Never Smoker   . Smokeless tobacco: Not on file  . Alcohol Use: No  . Drug Use: No  . Sexual Activity: Not on file   Other Topics Concern  . Not on file   Social History Narrative    Past Surgical History  Procedure Laterality Date  . Ruptured ovarian cyst    . Ablation on endometriosis      Family History  Problem Relation Age of Onset  . Depression Sister   . Cancer Maternal Grandmother     ovarian    Allergies  Allergen Reactions  . Penicillins     Current Outpatient Prescriptions on File Prior to Visit  Medication Sig Dispense Refill  . citalopram (CELEXA) 20 MG tablet Take 20 mg by mouth daily.    . cyclobenzaprine (FLEXERIL) 5 MG tablet Take 1 tablet (5 mg total) by mouth 3 (three) times daily as needed for muscle spasms. (Patient not taking: Reported on  08/06/2015) 30 tablet 1   No current facility-administered medications on file prior to visit.    BP 112/72 mmHg  Pulse 62  Temp(Src) 97.7 F (36.5 C) (Oral)  Ht 5\' 6"  (1.676 m)  Wt 135 lb 6.4 oz (61.417 kg)  BMI 21.86 kg/m2  SpO2 99%    Objective:   Physical Exam  Constitutional: She appears well-nourished.  HENT:  Right Ear: Tympanic membrane and ear canal normal.  Left Ear: Tympanic membrane and ear canal normal.  Nose: Right sinus exhibits maxillary sinus tenderness. Right sinus exhibits no frontal sinus tenderness. Left sinus exhibits maxillary sinus tenderness. Left sinus exhibits no frontal sinus tenderness.  Mouth/Throat: Posterior oropharyngeal erythema present. No oropharyngeal exudate or posterior oropharyngeal edema.  Neck: Neck supple.  Cardiovascular: Normal rate and regular rhythm.   Pulmonary/Chest: Effort normal and breath sounds normal. She has no rales.  Lymphadenopathy:    She has no cervical adenopathy.  Skin: Skin is warm and dry.          Assessment & Plan:  Acute Bronchitis  Cough, congestion, fatigue x 10 days, no with productive cough (yellow sputum) No improvement with supportive measures. Exam mostly unremarkable, lungs clear. Will send RX for Zpak as I suspect this may now be bacterial. Discussed supportive treatment such as fluids, rest, Delsym  for day cough. RX for Hycodan provided for HS. Discussed post bronchitic cough may last 1-2 weeks. Follow up PRN.

## 2015-08-06 NOTE — Patient Instructions (Signed)
Start Azithromycin antibiotics. Take 2 tablets by mouth today, then 1 tablet daily for 4 additional days.  You may take the Hycodan cough suppressant at bedtime as needed for cough and rest. Caution this medication contains codeine and will make you feel drowsy.  Increase consumption of fluids. Rest. Please notify us if no improvement in 3-4 days.  It was a pleasure meeting you!  Acute Bronchitis Bronchitis is inflammation of the airways that extend from the windpipe into the lungs (bronchi). The inflammation often causes mucus to develop. This leads to a cough, which is the most common symptom of bronchitis.  In acute bronchitis, the condition usually develops suddenly and goes away over time, usually in a couple weeks. Smoking, allergies, and asthma can make bronchitis worse. Repeated episodes of bronchitis may cause further lung problems.  CAUSES Acute bronchitis is most often caused by the same virus that causes a cold. The virus can spread from person to person (contagious) through coughing, sneezing, and touching contaminated objects. SIGNS AND SYMPTOMS   Cough.   Fever.   Coughing up mucus.   Body aches.   Chest congestion.   Chills.   Shortness of breath.   Sore throat.  DIAGNOSIS  Acute bronchitis is usually diagnosed through a physical exam. Your health care provider will also ask you questions about your medical history. Tests, such as chest X-rays, are sometimes done to rule out other conditions.  TREATMENT  Acute bronchitis usually goes away in a couple weeks. Oftentimes, no medical treatment is necessary. Medicines are sometimes given for relief of fever or cough. Antibiotic medicines are usually not needed but may be prescribed in certain situations. In some cases, an inhaler may be recommended to help reduce shortness of breath and control the cough. A cool mist vaporizer may also be used to help thin bronchial secretions and make it easier to clear the chest.   HOME CARE INSTRUCTIONS  Get plenty of rest.   Drink enough fluids to keep your urine clear or pale yellow (unless you have a medical condition that requires fluid restriction). Increasing fluids may help thin your respiratory secretions (sputum) and reduce chest congestion, and it will prevent dehydration.   Take medicines only as directed by your health care provider.  If you were prescribed an antibiotic medicine, finish it all even if you start to feel better.  Avoid smoking and secondhand smoke. Exposure to cigarette smoke or irritating chemicals will make bronchitis worse. If you are a smoker, consider using nicotine gum or skin patches to help control withdrawal symptoms. Quitting smoking will help your lungs heal faster.   Reduce the chances of another bout of acute bronchitis by washing your hands frequently, avoiding people with cold symptoms, and trying not to touch your hands to your mouth, nose, or eyes.   Keep all follow-up visits as directed by your health care provider.  SEEK MEDICAL CARE IF: Your symptoms do not improve after 1 week of treatment.  SEEK IMMEDIATE MEDICAL CARE IF:  You develop an increased fever or chills.   You have chest pain.   You have severe shortness of breath.  You have bloody sputum.   You develop dehydration.  You faint or repeatedly feel like you are going to pass out.  You develop repeated vomiting.  You develop a severe headache. MAKE SURE YOU:   Understand these instructions.  Will watch your condition.  Will get help right away if you are not doing well or get worse.  This information is not intended to replace advice given to you by your health care provider. Make sure you discuss any questions you have with your health care provider.   Document Released: 10/21/2004 Document Revised: 10/04/2014 Document Reviewed: 03/06/2013 Elsevier Interactive Patient Education [employer].

## 2015-08-07 ENCOUNTER — Telehealth: Payer: Self-pay

## 2015-08-07 ENCOUNTER — Telehealth: Payer: Self-pay | Admitting: Family Medicine

## 2015-08-07 MED ORDER — HYDROCODONE-HOMATROPINE 5-1.5 MG/5ML PO SYRP: 5.0000 mL | ORAL_SOLUTION | Freq: Every evening | ORAL | Status: DC | PRN

## 2015-08-07 NOTE — Telephone Encounter (Signed)
Pt left v/m requesting cough med sent to [place]; pt seen 08/06/15 and printed rx should have been given to pt; left vm per DPR for pt to ck paperwork given at end of visit on 08/06/15 for hycodan rx. If pt cannot find rx pt to cb.

## 2015-08-07 NOTE — Addendum Note (Signed)
Addended by: Jearld Fenton on: 08/07/2015 03:21 PM   Modules accepted: Orders, SmartSet

## 2015-08-07 NOTE — Telephone Encounter (Signed)
RX printed and signed and given to AS

## 2015-08-07 NOTE — Telephone Encounter (Signed)
Pt's husband called.  He states pt is in need of the cough medicine she was supposed to get.  He states she did not get a rx yesterday.  I told him we couldn't call it in because it's a controlled substance and someone would need to come pick it up.  Please call pt's husband today. [phone]

## 2015-08-29 ENCOUNTER — Encounter: Payer: Self-pay | Admitting: Family Medicine

## 2015-08-29 ENCOUNTER — Ambulatory Visit (INDEPENDENT_AMBULATORY_CARE_PROVIDER_SITE_OTHER): Payer: PRIVATE HEALTH INSURANCE | Admitting: Family Medicine

## 2015-08-29 VITALS — BP 116/78 | HR 76 | Temp 97.7°F | Ht 66.0 in | Wt 132.2 lb

## 2015-08-29 DIAGNOSIS — R109 Unspecified abdominal pain: Secondary | ICD-10-CM | POA: Insufficient documentation

## 2015-08-29 DIAGNOSIS — Z1159 Encounter for screening for other viral diseases: Secondary | ICD-10-CM | POA: Insufficient documentation

## 2015-08-29 DIAGNOSIS — Z23 Encounter for immunization: Secondary | ICD-10-CM

## 2015-08-29 DIAGNOSIS — R1030 Lower abdominal pain, unspecified: Secondary | ICD-10-CM

## 2015-08-29 LAB — COMPREHENSIVE METABOLIC PANEL
ALT: 9 U/L (ref 0–35)
AST: 18 U/L (ref 0–37)
Albumin: 4.3 g/dL (ref 3.5–5.2)
Alkaline Phosphatase: 53 U/L (ref 39–117)
BUN: 12 mg/dL (ref 6–23)
CO2: 30 mEq/L (ref 19–32)
Calcium: 9.6 mg/dL (ref 8.4–10.5)
Chloride: 104 mEq/L (ref 96–112)
Creatinine, Ser: 0.75 mg/dL (ref 0.40–1.20)
GFR: 82.88 mL/min (ref >60.00)
Glucose, Bld: 89 mg/dL (ref 70–99)
Potassium: 4.4 mEq/L (ref 3.5–5.1)
Sodium: 141 mEq/L (ref 135–145)
Total Bilirubin: 0.6 mg/dL (ref 0.2–1.2)
Total Protein: 7.2 g/dL (ref 6.0–8.3)

## 2015-08-29 LAB — CBC WITH DIFFERENTIAL/PLATELET
Basophils Absolute: 0 10*3/uL (ref 0.0–0.1)
Basophils Relative: 0.7 % (ref 0.0–3.0)
Eosinophils Absolute: 0.1 10*3/uL (ref 0.0–0.7)
Eosinophils Relative: 2 % (ref 0.0–5.0)
HCT: 39.4 % (ref 36.0–46.0)
Hemoglobin: 13.3 g/dL (ref 12.0–15.0)
Lymphocytes Relative: 33.3 % (ref 12.0–46.0)
Lymphs Abs: 1.4 10*3/uL (ref 0.7–4.0)
MCHC: 33.8 g/dL (ref 30.0–36.0)
MCV: 92 fl (ref 78.0–100.0)
Monocytes Absolute: 0.3 10*3/uL (ref 0.1–1.0)
Monocytes Relative: 8 % (ref 3.0–12.0)
Neutro Abs: 2.4 10*3/uL (ref 1.4–7.7)
Neutrophils Relative %: 56 % (ref 43.0–77.0)
Platelets: 204 10*3/uL (ref 150.0–400.0)
RBC: 4.29 Mil/uL (ref 3.87–5.11)
RDW: 13.5 % (ref 11.5–15.5)
WBC: 4.3 10*3/uL (ref 4.0–10.5)

## 2015-08-29 NOTE — Patient Instructions (Signed)
Lab today  Eat bland for now  Avoid nuts and seeds and popcorn for now also   Stop at check out to set up CT    Hep A shot and flu shot today

## 2015-08-29 NOTE — Progress Notes (Signed)
Subjective:    Patient ID: Christie Lin, female    DOB: 12/20/1950, 64 y.o.   MRN: [id number]  HPI Here with abd symptoms  Last week had an episode of severe abd pain (traveled - mid abdomen to lower - traveled to different places) -- not pelvic  Last ate baked potato  Crawled to bathroom -laid in fetal pos on the floor Then a cold sweat  Then it resolved -felt limp  Sat on the toilet - soft stool/ not watery -no blood   Had it happen once before 2 y ago -not this intense   Last colonoscopy 2004 - discovered her colon was twisted and then had a barium enema  No hx of diverticulosis    Pain returned the next day-no bm with it   Since then - a little slight cramping   Early this week - she started having some urinary incontinence- unusual for her   No fever  Has not been sick   Has not eaten anything out of the ordinary   Going out of the country in 3 wks - [place] (mission trip- to help children)  Last hep A vaccine - did not have the booster Has not had a flu shot   Patient Active Problem List   Diagnosis Date Noted  . Thoracic back pain 08/13/2013  . Right knee pain 08/13/2013  . Baker's cyst of knee 08/13/2013  . Near syncope 10/12/2012  . Syncope 09/25/2012  . Hypotension 09/25/2012  . Thoracic sprain 09/08/2012  . VERTIGO 01/17/2009   Past Medical History  Diagnosis Date  . Endometriosis   . Depression     grief reaction   Past Surgical History  Procedure Laterality Date  . Ruptured ovarian cyst    . Ablation on endometriosis     Social History  Substance Use Topics  . Smoking status: Never Smoker   . Smokeless tobacco: None  . Alcohol Use: No   Family History  Problem Relation Age of Onset  . Depression Sister   . Cancer Maternal Grandmother     ovarian   Allergies  Allergen Reactions  . Penicillins    Current Outpatient Prescriptions on File Prior to Visit  Medication Sig Dispense Refill  . citalopram (CELEXA) 20 MG tablet Take 20  mg by mouth daily.    . cyclobenzaprine (FLEXERIL) 5 MG tablet Take 1 tablet (5 mg total) by mouth 3 (three) times daily as needed for muscle spasms. (Patient not taking: Reported on 08/06/2015) 30 tablet 1   No current facility-administered medications on file prior to visit.      Review of Systems    Review of Systems  Constitutional: Negative for fever, appetite change, fatigue and unexpected weight change.  Eyes: Negative for pain and visual disturbance.  Respiratory: Negative for cough and shortness of breath.   Cardiovascular: Negative for cp or palpitations    Gastrointestinal: Negative for nausea, constipation or blood in stool/black stool  Genitourinary: Negative for urgency and frequency.  Skin: Negative for pallor or rash   Neurological: Negative for weakness, light-headedness, numbness and headaches.  Hematological: Negative for adenopathy. Does not bruise/bleed easily.  Psychiatric/Behavioral: Negative for dysphoric mood. The patient is not nervous/anxious.      Objective:   Physical Exam  Constitutional: She appears well-developed and well-nourished. No distress.  Well appearing   HENT:  Head: Normocephalic and atraumatic.  Mouth/Throat: Oropharynx is clear and moist.  Eyes: Conjunctivae and EOM are normal. Pupils are equal, round, and  reactive to light. No scleral icterus.  Neck: Normal range of motion. Neck supple.  Cardiovascular: Normal rate, regular rhythm and normal heart sounds.   Pulmonary/Chest: Effort normal and breath sounds normal. No respiratory distress. She has no wheezes. She has no rales.  Abdominal: Soft. Bowel sounds are normal. She exhibits no distension, no pulsatile liver, no abdominal bruit, no pulsatile midline mass and no mass. There is no hepatosplenomegaly. There is no tenderness. There is no rebound, no guarding and no CVA tenderness.  Lymphadenopathy:    She has no cervical adenopathy.  Neurological: She is alert. No cranial nerve deficit.  She exhibits normal muscle tone. Coordination normal.  Skin: Skin is warm and dry. No erythema. No pallor.  Psychiatric: She has a normal mood and affect.          Assessment & Plan:   Problem List Items Addressed This Visit      Other   Abdominal pain - Primary    Low abd intermittent- brief and severe Unable to give urine sample today  Labs today CT ordered Disc diff incl diverticular dz/ IBS or inflam BD, or ischemic bowel dz  If symptoms re occur will seek care asap and alert [place]  Pending results  Enc bland diet for now       Relevant Orders   CBC with Differential/Platelet (Completed)   Comprehensive metabolic panel (Completed)   CT Abdomen Pelvis W Contrast   Need for hepatitis C screening test    Added to labs Not high risk      Relevant Orders   Hepatitis C antibody (Completed)    Other Visit Diagnoses    Need for influenza vaccination        Relevant Orders    Flu Vaccine QUAD 36+ mos PF IM (Fluarix & Fluzone Quad PF) (Completed)    Need for hepatitis A immunization        Relevant Orders    Hepatitis A vaccine adult IM (Completed)

## 2015-08-29 NOTE — Progress Notes (Signed)
Pre visit review using our clinic review tool, if applicable. No additional management support is needed unless otherwise documented below in the visit note. 

## 2015-08-30 LAB — HEPATITIS C ANTIBODY: HCV Ab: NEGATIVE

## 2015-08-31 NOTE — Assessment & Plan Note (Signed)
Added to labs Not high risk

## 2015-08-31 NOTE — Assessment & Plan Note (Addendum)
Low abd intermittent- brief and severe Unable to give urine sample today  Labs today CT ordered Disc diff incl diverticular dz/ IBS or inflam BD, or ischemic bowel dz  If symptoms re occur will seek care asap and alert [place]  Pending results  Enc bland diet for now

## 2015-09-05 ENCOUNTER — Ambulatory Visit
Admission: RE | Admit: 2015-09-05 | Discharge: 2015-09-05 | Disposition: A | Discharge status: Home / Self Care | Payer: PRIVATE HEALTH INSURANCE | Source: Ambulatory Visit | Attending: Family Medicine | Admitting: Family Medicine

## 2015-09-05 DIAGNOSIS — R918 Other nonspecific abnormal finding of lung field: Secondary | ICD-10-CM | POA: Diagnosis not present

## 2015-09-05 DIAGNOSIS — R1030 Lower abdominal pain, unspecified: Secondary | ICD-10-CM | POA: Diagnosis not present

## 2015-09-05 MED ORDER — IOHEXOL 350 MG/ML SOLN
100.0000 mL | Freq: Once | INTRAVENOUS | Status: AC | PRN
  Administered 2015-09-05: 100 mL via INTRAVENOUS

## 2015-09-10 ENCOUNTER — Telehealth: Payer: Self-pay | Admitting: Family Medicine

## 2015-09-10 DIAGNOSIS — R05 Cough: Secondary | ICD-10-CM

## 2015-09-10 DIAGNOSIS — R1030 Lower abdominal pain, unspecified: Secondary | ICD-10-CM

## 2015-09-10 DIAGNOSIS — R059 Cough, unspecified: Secondary | ICD-10-CM

## 2015-09-10 MED ORDER — AZITHROMYCIN 250 MG PO TABS: ORAL_TABLET | ORAL | Status: DC

## 2015-09-10 NOTE — Telephone Encounter (Signed)
Please call in zpak- I cannot get [place] listing for pharmacy? -not sure what I'm doing wrong F/u if no improvement  Send this back to me please and I will work on GI ref

## 2015-09-10 NOTE — Telephone Encounter (Signed)
-----   Message from Tammi Sou, [place] sent at 09/09/2015  3:51 PM EST ----- Pt notified of CT results and Dr. Marliss Coots comments. Pt does want a referral to GI but she is leaving to go out of town and she isn't returning until 10/27/15, so appt would have to be after 10/27/15  Pt did want me to let you know that she has all of the sxs she saw Allie Bossier, NP for in Nov., pt said for the last 2 weeks she has been having bad congestion, HA, ST, and cough, pt is about to go out of town and she will be gone until 10/03/28 and she is afraid to leave sick so she is requesting the abx that Anda Kraft gave her or a different abx if you think she needs a different one, pt uses [facility] [place], please advise   CB#[phone]

## 2015-09-10 NOTE — Telephone Encounter (Signed)
Rx sent to pharmacy and pt notified and message sent back to Dr. Glori Bickers so she can put the GI referral in

## 2015-09-11 NOTE — Telephone Encounter (Signed)
Ref done Will route to [facility] 

## 2015-09-11 NOTE — Telephone Encounter (Signed)
Pt will call us when she gets back from out of town

## 2015-11-21 ENCOUNTER — Ambulatory Visit (INDEPENDENT_AMBULATORY_CARE_PROVIDER_SITE_OTHER): Payer: PRIVATE HEALTH INSURANCE | Admitting: Family Medicine

## 2015-11-21 ENCOUNTER — Encounter: Payer: Self-pay | Admitting: Family Medicine

## 2015-11-21 VITALS — BP 112/80 | HR 73 | Temp 97.7°F | Ht 66.0 in | Wt 130.2 lb

## 2015-11-21 DIAGNOSIS — R1030 Lower abdominal pain, unspecified: Secondary | ICD-10-CM

## 2015-11-21 DIAGNOSIS — R918 Other nonspecific abnormal finding of lung field: Secondary | ICD-10-CM

## 2015-11-21 DIAGNOSIS — Z1211 Encounter for screening for malignant neoplasm of colon: Secondary | ICD-10-CM | POA: Insufficient documentation

## 2015-11-21 DIAGNOSIS — I951 Orthostatic hypotension: Secondary | ICD-10-CM | POA: Diagnosis not present

## 2015-11-21 MED ORDER — MIDODRINE HCL 5 MG PO TABS: 5.0000 mg | ORAL_TABLET | Freq: Three times a day (TID) | ORAL | Status: DC | PRN

## 2015-11-21 NOTE — Patient Instructions (Signed)
Take minodrine when needed for low bp Keep drinking water/eating salt and wearing support stockings if needed Stop at check out for ref to GI and also for CT

## 2015-11-21 NOTE — Progress Notes (Signed)
Subjective:    Patient ID: Christie Lin, female    DOB: 1951-05-11, 65 y.o.   MRN: [id number]  HPI Here for f/u of hypotension and to review CT scan from dec   Doing ok overall  BP Readings from Last 3 Encounters:  11/21/15 112/80  08/29/15 116/78  08/06/15 112/72   she did increase fluid and salt intake  She did gain about 6 months  Has very infrequent episodes - but still happens    Given minodrine and florinef in the past      CT Abdomen Pelvis W Contrast   Status: Final result       PACS Images     Show images for CT Abdomen Pelvis W Contrast     Study Result     CLINICAL DATA: Chronic lower abdominal pain. History of endometriosis.  EXAM: CT ABDOMEN AND PELVIS WITH CONTRAST  TECHNIQUE: Multidetector CT imaging of the abdomen and pelvis was performed using the standard protocol following bolus administration of intravenous contrast.  CONTRAST: 152mL OMNIPAQUE IOHEXOL 350 MG/ML SOLN  COMPARISON: None.  FINDINGS: Lower chest: There is a 6 mm nodular opacity abutting the pleura in the posterior segment of the left lower lobe seen on axial slice 3 series 5. A similar appearing 2 mm nodular opacity abuts the pleura in this area, seen on axial slice 4 series 5. Lung bases elsewhere are clear.  Hepatobiliary: No focal liver lesions are identified. The gallbladder is mildly contracted without appreciable wall thickening. There is no biliary duct dilatation.  Pancreas: No pancreatic mass or inflammatory focus.  Spleen: There is a 5 mm cyst in the posterior spleen. Spleen otherwise appears normal.  Adrenals/Urinary Tract: No adrenal lesions are appreciable. There is mild scarring in the upper pole of the right kidney. There is no renal mass or hydronephrosis on either side. Each kidney has an extrarenal pelvis, an anatomic variant. There is no renal or ureteral calculus on either side. Urinary bladder is midline with wall thickness  within normal limits.  Stomach/Bowel: Rectum is mildly distended with air. There is diffuse stool throughout the colon. There is no bowel wall or mesenteric thickening. No bowel obstruction. No free air or portal venous air.  Vascular/Lymphatic: There is no abdominal aortic aneurysm. Major mesenteric vessels appear intact. There is mild atherosclerotic calcification in both common iliac arteries. There is no adenopathy in the abdomen or pelvis.  Reproductive: Uterus is anteverted. No pelvic mass or pelvic fluid collection.  Other: Appendix appears normal. No abscess or ascites noted in the abdomen or pelvis.  Musculoskeletal: There is thoracolumbar levoscoliosis. There is a probable bone island in the right iliac crest region. There are no lytic or destructive bone lesions. There are no intramuscular or abdominal wall lesions.  IMPRESSION: Diffuse stool throughout colon. Question constipation.  No bowel obstruction. No abscess. Appendix appears normal.  Scarring upper pole right kidney. No renal or ureteral calculus. No hydronephrosis.  6 mm nodular opacity abutting the pleura in the posterior segment left lower lobe. Followup of this nodular opacity should be based on Fleischner Society guidelines. If the patient is at high risk for bronchogenic carcinoma, follow-up chest CT at 6-12 months is recommended. If the patient is at low risk for bronchogenic carcinoma, follow-up chest CT at 12 months is recommended. This recommendation follows the consensus statement: Guidelines for Management of Small Pulmonary Nodules Detected on CT Scans: A Statement from the [place] as published in Radiology 2005;237:395-400.   Had this for  abdominal pain (low )- which she has twice per year Is very severe when she has it twice a year   Several lung nodules  Grew up in a smoky house - until college  No chemical exposure   Goes back to the gyn every July  Has not  mentioned the pain   Patient Active Problem List   Diagnosis Date Noted  . Pulmonary nodules 11/21/2015  . Colon cancer screening 11/21/2015  . Abdominal pain 08/29/2015  . Need for hepatitis C screening test 08/29/2015  . Thoracic back pain 08/13/2013  . Right knee pain 08/13/2013  . Baker's cyst of knee 08/13/2013  . Near syncope 10/12/2012  . Syncope 09/25/2012  . Hypotension 09/25/2012  . Thoracic sprain 09/08/2012  . VERTIGO 01/17/2009   Past Medical History  Diagnosis Date  . Endometriosis   . Depression     grief reaction   Past Surgical History  Procedure Laterality Date  . Ruptured ovarian cyst    . Ablation on endometriosis     Social History  Substance Use Topics  . Smoking status: Never Smoker   . Smokeless tobacco: None  . Alcohol Use: No   Family History  Problem Relation Age of Onset  . Depression Sister   . Cancer Maternal Grandmother     ovarian   Allergies  Allergen Reactions  . Penicillins    Current Outpatient Prescriptions on File Prior to Visit  Medication Sig Dispense Refill  . citalopram (CELEXA) 20 MG tablet Take 20 mg by mouth daily.    . cyclobenzaprine (FLEXERIL) 5 MG tablet Take 1 tablet (5 mg total) by mouth 3 (three) times daily as needed for muscle spasms. 30 tablet 1   No current facility-administered medications on file prior to visit.    Review of Systems Review of Systems  Constitutional: Negative for fever, appetite change, fatigue and unexpected weight change.  Eyes: Negative for pain and visual disturbance.  Respiratory: Negative for cough and shortness of breath.   Cardiovascular: Negative for cp or palpitations    Gastrointestinal: Negative for nausea, diarrhea and constipation. pos for occ lower abdominal pain /cramping  Genitourinary: Negative for urgency and frequency.  Skin: Negative for pallor or rash   Neurological: Negative for weakness, light-headedness, numbness and headaches.  Hematological: Negative for  adenopathy. Does not bruise/bleed easily.  Psychiatric/Behavioral: Negative for dysphoric mood. The patient is not nervous/anxious.         Objective:   Physical Exam  Constitutional: She appears well-developed and well-nourished. No distress.  Well appearing   HENT:  Head: Normocephalic and atraumatic.  Mouth/Throat: Oropharynx is clear and moist.  Eyes: Conjunctivae and EOM are normal. Pupils are equal, round, and reactive to light.  Neck: Normal range of motion. Neck supple. No JVD present. Carotid bruit is not present. No thyromegaly present.  Cardiovascular: Normal rate, regular rhythm, normal heart sounds and intact distal pulses.  Exam reveals no gallop.   Not hypotensive at visit   Pulmonary/Chest: Effort normal and breath sounds normal. No respiratory distress. She has no wheezes. She has no rales.  No crackles  Abdominal: Soft. Bowel sounds are normal. She exhibits no distension, no abdominal bruit and no mass. There is no tenderness. There is no rebound and no guarding.  Musculoskeletal: She exhibits no edema.  Lymphadenopathy:    She has no cervical adenopathy.  Neurological: She is alert. She has normal reflexes.  Skin: Skin is warm and dry. No rash noted.  Psychiatric: She has  a normal mood and affect.          Assessment & Plan:   Problem List Items Addressed This Visit      Cardiovascular and Mediastinum   Hypotension    Rev her cardiology w/u for this in the past  Reminded to try support hose  Also minodrine prn episodes Fluids/salt         Relevant Medications   midodrine (PROAMATINE) 5 MG tablet     Other   Abdominal pain - Primary    Infrequent but sometimes severe  Will f/u with GI for this and also to discuss colonoscopy (due for screening)       Relevant Orders   Ambulatory referral to Gastroenterology   Colon cancer screening    Due for colonoscopy  Will f/u with GI  Ref done       Relevant Orders   Ambulatory referral to  Gastroenterology   Pulmonary nodules    Incidental find in CT scan  Non smoker-some past exp to 2nd hand smoke 6-12 y f/u recommended Pt would like to do that in June  Will set this up       Relevant Orders   CT Chest Wo Contrast

## 2015-11-21 NOTE — Progress Notes (Signed)
Pre visit review using our clinic review tool, if applicable. No additional management support is needed unless otherwise documented below in the visit note. 

## 2015-11-23 NOTE — Assessment & Plan Note (Signed)
Due for colonoscopy  Will f/u with GI  Ref done

## 2015-11-23 NOTE — Assessment & Plan Note (Signed)
Rev her cardiology w/u for this in the past  Reminded to try support hose  Also minodrine prn episodes Fluids/salt

## 2015-11-23 NOTE — Assessment & Plan Note (Signed)
Infrequent but sometimes severe  Will f/u with GI for this and also to discuss colonoscopy (due for screening)

## 2015-11-23 NOTE — Assessment & Plan Note (Signed)
Incidental find in CT scan  Non smoker-some past exp to 2nd hand smoke 6-12 y f/u recommended Pt would like to do that in June  Will set this up

## 2016-01-23 ENCOUNTER — Telehealth: Payer: Self-pay | Admitting: Gastroenterology

## 2016-01-23 ENCOUNTER — Ambulatory Visit: Payer: PRIVATE HEALTH INSURANCE | Admitting: Gastroenterology

## 2016-01-23 NOTE — Telephone Encounter (Signed)
Yes, charge. 

## 2016-01-23 NOTE — Telephone Encounter (Signed)
Do you want to charge? 

## 2016-03-22 ENCOUNTER — Telehealth: Payer: Self-pay | Admitting: *Deleted

## 2016-03-22 DIAGNOSIS — R918 Other nonspecific abnormal finding of lung field: Secondary | ICD-10-CM

## 2016-03-22 NOTE — Telephone Encounter (Signed)
-----   Message from Abner Greenspan, MD sent at 03/20/2016  7:22 PM EDT ----- She is due for her 6 mo CT of the chest for follow lung nodules from December - please ask her if she wants [place] to schedule that, thanks ----- Message -----    From: Abner Greenspan, MD    Sent: 03/07/2016      To: Abner Greenspan, MD

## 2016-03-22 NOTE — Telephone Encounter (Signed)
Pt agrees with referral for 6 month CT, pt said she can't go between 04/05/16- 04/20/16 due to her being out of town, but she can go any other time, I advise pt our [facility] will call to schedule appt

## 2016-03-22 NOTE — Telephone Encounter (Signed)
Done-will route to [facility] 

## 2016-03-23 ENCOUNTER — Ambulatory Visit (INDEPENDENT_AMBULATORY_CARE_PROVIDER_SITE_OTHER): Payer: PRIVATE HEALTH INSURANCE | Admitting: Gastroenterology

## 2016-03-23 ENCOUNTER — Encounter: Payer: Self-pay | Admitting: Gastroenterology

## 2016-03-23 VITALS — BP 98/60 | HR 68 | Ht 66.0 in | Wt 134.0 lb

## 2016-03-23 DIAGNOSIS — R1030 Lower abdominal pain, unspecified: Secondary | ICD-10-CM

## 2016-03-23 DIAGNOSIS — Z86010 Personal history of colonic polyps: Secondary | ICD-10-CM | POA: Diagnosis not present

## 2016-03-23 DIAGNOSIS — K5900 Constipation, unspecified: Secondary | ICD-10-CM | POA: Diagnosis not present

## 2016-03-23 DIAGNOSIS — Z1212 Encounter for screening for malignant neoplasm of rectum: Secondary | ICD-10-CM

## 2016-03-23 DIAGNOSIS — Z1211 Encounter for screening for malignant neoplasm of colon: Secondary | ICD-10-CM | POA: Diagnosis not present

## 2016-03-23 MED ORDER — NA SULFATE-K SULFATE-MG SULF 17.5-3.13-1.6 GM/177ML PO SOLN: 1.0000 | Freq: Once | ORAL | Status: DC

## 2016-03-23 NOTE — Patient Instructions (Signed)
You have been scheduled for a colonoscopy. Please follow written instructions given to you at your visit today.  Please pick up your prep supplies at the pharmacy within the next 1-3 days. If you use inhalers (even only as needed), please bring them with you on the day of your procedure. Your physician has requested that you go to www.startemmi.com and enter the access code given to you at your visit today. This web site gives a general overview about your procedure. However, you should still follow specific instructions given to you by our office regarding your preparation for the procedure.  Thank you for choosing me and [facility].  Malcolm T. Stark, Jr., MD., FACG  

## 2016-03-23 NOTE — Progress Notes (Signed)
History of Present Illness: This is a 65 year old female referred by Tower, Wynelle Fanny, MD for the evaluation of intermittent abdominal pain, constipation and CRC screening. Patient relates about a 30 year history of intermittent severe lower abdominal pains. Symptoms may occur 2-4 times per year, last a few minutes at a time and then resolve. Has not been associated with bowel movements, meals or any particular activity. She underwent a CT scan several months ago as outlined below. She states she takes Metamucil daily which helps her constipation. She previously had an incomplete colonoscopy to the hepatic flexure in 08/2003 due to a tortuous colon and patient discomfort. Internal hemorrhoids were noted and a 6 mm sigmoid colon polyp was removed however the pathology report cannot be located at this time. She subsequently had an air-contrast barium enema that showed 2 polyps in the proximal sigmoid colon measuring 2-3 mm. This area was seen well at colonoscopy so it appears that no additional evaluation was performed. Denies weight loss, diarrhea, change in stool caliber, melena, hematochezia, nausea, vomiting, dysphagia, reflux symptoms, chest pain.  09/15/2015 abd/pelvic CT scan IMPRESSION: Diffuse stool throughout colon. Question constipation.  No bowel obstruction. No abscess. Appendix appears normal.  Scarring upper pole right kidney. No renal or ureteral calculus. No hydronephrosis.  6 mm nodular opacity abutting the pleura in the posterior segment left lower lobe. Followup of this nodular opacity should be based on Fleischner Society guidelines. If the patient is at high risk for bronchogenic carcinoma, follow-up chest CT at 6-12 months is recommended. If the patient is at low risk for bronchogenic carcinoma, follow-up chest CT at 12 months is recommended. This recommendation follows the consensus statement: Guidelines for Management of Small Pulmonary Nodules Detected on CT Scans:  A Statement from the [place] as published in Radiology 2005;237:395-400.   Review of Systems: Pertinent positive and negative review of systems were noted in the above HPI section. All other review of systems were otherwise negative.  Current Medications, Allergies, Past Medical History, Past Surgical History, Family History and Social History were reviewed in [employer] record.  Physical Exam: General: Well developed, well nourished, no acute distress Head: Normocephalic and atraumatic Eyes:  sclerae anicteric, EOMI Ears: Normal auditory acuity Mouth: No deformity or lesions Neck: Supple, no masses or thyromegaly Lungs: Clear throughout to auscultation Heart: Regular rate and rhythm; no murmurs, rubs or bruits Abdomen: Soft, non tender and non distended. No masses, hepatosplenomegaly or hernias noted. Normal Bowel sounds Rectal: deferred to colonoscopy Musculoskeletal: Symmetrical with no gross deformities  Skin: No lesions on visible extremities Pulses:  Normal pulses noted Extremities: No clubbing, cyanosis, edema or deformities noted Neurological: Alert oriented x 4, grossly nonfocal Cervical Nodes:  No significant cervical adenopathy Inguinal Nodes: No significant inguinal adenopathy Psychological:  Alert and cooperative. Normal mood and affect  Assessment and Recommendations:  1. Infrequent brief severe lower abdominal pains, etiology unclear. CT unremarkable for GI disorders. Symptoms are c/w abdominal wall muscle cramping or bowel spasm. No additional evaluation at this time.   2. Constipation. Increase fiber and daily water intake. Continue daily Metamucil.  3. Colorectal cancer screening. History of 6 mm sigmoid colon polyp removed in 08/2003, unable to locate pathology at this time. Colonoscopy in 08/2003 only completed to the hepatic flexure. Will attempt to locate the pathology. Offered her the option of an air-contrast barium enema,  CT colonoscopy or colonoscopy. She understands that the colonoscopy could be incomplete again. After reviewing the  options she prefers to have colonoscopy however she would like to check the finances of the options with her insurance carrier as she has a very high Public relations account executive. She will call if she decides to change to an ACBE or CT colonoscopy. Schedule colonoscopy. The risks (including bleeding, perforation, infection, missed lesions, medication reactions and possible hospitalization or surgery if complications occur), benefits, and alternatives to colonoscopy with possible biopsy and possible polypectomy were discussed with the patient and they consent to proceed.    cc: Abner Greenspan, MD [address] [place], [place] [address]

## 2016-03-29 ENCOUNTER — Ambulatory Visit
Admission: RE | Admit: 2016-03-29 | Discharge: 2016-03-29 | Disposition: A | Discharge status: Home / Self Care | Payer: No Typology Code available for payment source | Source: Ambulatory Visit | Attending: Family Medicine | Admitting: Family Medicine

## 2016-03-29 DIAGNOSIS — R918 Other nonspecific abnormal finding of lung field: Secondary | ICD-10-CM | POA: Insufficient documentation

## 2016-04-11 ENCOUNTER — Other Ambulatory Visit: Payer: Self-pay | Admitting: Family Medicine

## 2016-04-11 ENCOUNTER — Telehealth: Payer: Self-pay | Admitting: Family Medicine

## 2016-04-11 DIAGNOSIS — H10023 Other mucopurulent conjunctivitis, bilateral: Secondary | ICD-10-CM

## 2016-04-11 MED ORDER — MOXIFLOXACIN HCL 0.5 % OP SOLN: 1.0000 [drp] | Freq: Three times a day (TID) | OPHTHALMIC | Status: DC

## 2016-04-11 NOTE — Telephone Encounter (Signed)
Pt is in [place] watching her grandchildren---- she got there last night and her grandchildren here have pink eye.  She woke up today with green/ yellow d/c---- her grandchildren in [place] were seen by peds and rx abx drops. vigamox called into pharmacy in [place] --- [facility] to go [facility] if no better in 3-4 days

## 2016-05-28 ENCOUNTER — Telehealth: Payer: Self-pay | Admitting: Gastroenterology

## 2016-05-28 DIAGNOSIS — Z1212 Encounter for screening for malignant neoplasm of rectum: Principal | ICD-10-CM

## 2016-05-28 DIAGNOSIS — Z1211 Encounter for screening for malignant neoplasm of colon: Secondary | ICD-10-CM

## 2016-05-28 MED ORDER — NA SULFATE-K SULFATE-MG SULF 17.5-3.13-1.6 GM/177ML PO SOLN
1.0000 | Freq: Once | ORAL | 0 refills | Status: AC
Start: 2016-05-28 — End: 2016-05-28

## 2016-05-28 NOTE — Telephone Encounter (Signed)
Prescription sent to patient's pharmacy.

## 2016-06-02 ENCOUNTER — Ambulatory Visit (AMBULATORY_SURGERY_CENTER): Payer: Self-pay | Admitting: Gastroenterology

## 2016-06-02 ENCOUNTER — Encounter: Payer: Self-pay | Admitting: Gastroenterology

## 2016-06-02 VITALS — BP 106/59 | HR 64 | Temp 97.5°F | Resp 12

## 2016-06-02 DIAGNOSIS — K635 Polyp of colon: Secondary | ICD-10-CM

## 2016-06-02 DIAGNOSIS — D122 Benign neoplasm of ascending colon: Secondary | ICD-10-CM

## 2016-06-02 DIAGNOSIS — D125 Benign neoplasm of sigmoid colon: Secondary | ICD-10-CM

## 2016-06-02 DIAGNOSIS — Z86010 Personal history of colonic polyps: Secondary | ICD-10-CM

## 2016-06-02 MED ORDER — SODIUM CHLORIDE 0.9 % IV SOLN: 500.0000 mL | INTRAVENOUS | Status: DC

## 2016-06-02 NOTE — Op Note (Signed)
[facility] Patient Name: Christie Lin Procedure Date: 06/02/2016 8:25 AM MRN: [id number] Endoscopist: Ladene Artist , MD Age: 65 Referring MD:  Date of Birth: [birth year] Gender: Female Account #: [id number] Procedure:                Colonoscopy Indications:              High risk colon cancer surveillance: Personal                            history of colonic polyps Medicines:                Monitored Anesthesia Care Procedure:                Pre-Anesthesia Assessment:                           - Prior to the procedure, a History and Physical                            was performed, and patient medications and                            allergies were reviewed. The patient's tolerance of                            previous anesthesia was also reviewed. The risks                            and benefits of the procedure and the sedation                            options and risks were discussed with the patient.                            All questions were answered, and informed consent                            was obtained. Prior Anticoagulants: The patient has                            taken no previous anticoagulant or antiplatelet                            agents. ASA Grade Assessment: II - A patient with                            mild systemic disease. After reviewing the risks                            and benefits, the patient was deemed in                            satisfactory condition to undergo the procedure.  After obtaining informed consent, the colonoscope                            was passed under direct vision. Throughout the                            procedure, the patient's blood pressure, pulse, and                            oxygen saturations were monitored continuously. The                            Model PCF-[id number]) scope was introduced                            through the anus and advanced to the  the cecum,                            identified by appendiceal orifice and ileocecal                            valve. The ileocecal valve, appendiceal orifice,                            and rectum were photographed. The quality of the                            bowel preparation was excellent. The colonoscopy                            was performed without difficulty. The patient                            tolerated the procedure well. Scope In: 8:30:02 AM Scope Out: 8:49:54 AM Scope Withdrawal Time: 0 hours 13 minutes 43 seconds  Total Procedure Duration: 0 hours 19 minutes 52 seconds  Findings:                 Two sessile polyps were found in the sigmoid colon                            and ascending colon. The polyps were 5 to 8 mm in                            size. These polyps were removed with a cold snare.                            Resection and retrieval were complete.                           Internal hemorrhoids were found during                            retroflexion. The hemorrhoids were small and Grade  I (internal hemorrhoids that do not prolapse).                           The exam was otherwise without abnormality on                            direct and retroflexion views.                           A few small-mouthed diverticula were found in the                            sigmoid colon. Complications:            No immediate complications. Estimated blood loss:                            None. Estimated Blood Loss:     Estimated blood loss: none. Impression:               - Two 5 to 8 mm polyps in the sigmoid colon and in                            the ascending colon, removed with a cold snare.                            Resected and retrieved.                           - Internal hemorrhoids.                           - The examination was otherwise normal on direct                            and retroflexion views.                            - Diverticulosis in the sigmoid colon. Recommendation:           - Repeat colonoscopy in 5 years for surveillance.                           - Patient has a contact number available for                            emergencies. The signs and symptoms of potential                            delayed complications were discussed with the                            patient. Return to normal activities tomorrow.                            Written discharge instructions were provided to the  patient.                           - Resume previous diet.                           - Continue present medications.                           - Await pathology results. Ladene Artist, MD 06/02/2016 8:53:02 AM This report has been signed electronically.

## 2016-06-02 NOTE — Patient Instructions (Signed)
Colon polyps removed today. Handout given on polyps,diverticulosis,hemorrhoids. Resume current medications. Repeat colonoscopy in 5 years. Result letter in your mail in 2-3 weeks. Call us with any questions or concerns. Thank you!  YOU HAD AN ENDOSCOPIC PROCEDURE TODAY AT [facility]:   Refer to the procedure report that was given to you for any specific questions about what was found during the examination.  If the procedure report does not answer your questions, please call your gastroenterologist to clarify.  If you requested that your care partner not be given the details of your procedure findings, then the procedure report has been included in a sealed envelope for you to review at your convenience later.  YOU SHOULD EXPECT: Some feelings of bloating in the abdomen. Passage of more gas than usual.  Walking can help get rid of the air that was put into your GI tract during the procedure and reduce the bloating. If you had a lower endoscopy (such as a colonoscopy or flexible sigmoidoscopy) you may notice spotting of blood in your stool or on the toilet paper. If you underwent a bowel prep for your procedure, you may not have a normal bowel movement for a few days.  Please Note:  You might notice some irritation and congestion in your nose or some drainage.  This is from the oxygen used during your procedure.  There is no need for concern and it should clear up in a day or so.  SYMPTOMS TO REPORT IMMEDIATELY:   Following lower endoscopy (colonoscopy or flexible sigmoidoscopy):  Excessive amounts of blood in the stool  Significant tenderness or worsening of abdominal pains  Swelling of the abdomen that is new, acute  Fever of 100F or higher  For urgent or emergent issues, a gastroenterologist can be reached at any hour by calling [phone].   DIET:  We do recommend a small meal at first, but then you may proceed to your regular diet.  Drink plenty of fluids but you  should avoid alcoholic beverages for 24 hours.  ACTIVITY:  You should plan to take it easy for the rest of today and you should NOT DRIVE or use heavy machinery until tomorrow (because of the sedation medicines used during the test).    FOLLOW UP: Our staff will call the number listed on your records the next business day following your procedure to check on you and address any questions or concerns that you may have regarding the information given to you following your procedure. If we do not reach you, we will leave a message.  However, if you are feeling well and you are not experiencing any problems, there is no need to return our call.  We will assume that you have returned to your regular daily activities without incident.  If any biopsies were taken you will be contacted by phone or by letter within the next 1-3 weeks.  Please call us at [phone] if you have not heard about the biopsies in 3 weeks.    SIGNATURES/CONFIDENTIALITY: You and/or your care partner have signed paperwork which will be entered into your electronic medical record.  These signatures attest to the fact that that the information above on your After Visit Summary has been reviewed and is understood.  Full responsibility of the confidentiality of this discharge information lies with you and/or your care-partner.

## 2016-06-02 NOTE — Progress Notes (Signed)
Report to PACU, RN, vss, BBS= Clear.  

## 2016-06-02 NOTE — Progress Notes (Signed)
Called to room to assist during endoscopic procedure.  Patient ID and intended procedure confirmed with present staff. Received instructions for my participation in the procedure from the performing physician.  

## 2016-06-03 ENCOUNTER — Telehealth: Payer: Self-pay | Admitting: *Deleted

## 2016-06-03 NOTE — Telephone Encounter (Signed)
No answer, left message to call if questions or concerns. 

## 2016-06-09 ENCOUNTER — Encounter: Payer: Self-pay | Admitting: Gastroenterology

## 2016-07-14 ENCOUNTER — Other Ambulatory Visit: Payer: Self-pay | Admitting: Obstetrics and Gynecology

## 2016-07-14 DIAGNOSIS — Z1231 Encounter for screening mammogram for malignant neoplasm of breast: Secondary | ICD-10-CM

## 2016-08-23 ENCOUNTER — Ambulatory Visit: Payer: No Typology Code available for payment source | Attending: Obstetrics and Gynecology

## 2016-09-02 ENCOUNTER — Telehealth: Payer: Self-pay | Admitting: Family Medicine

## 2016-09-02 NOTE — Telephone Encounter (Signed)
Patient Name: Christie Lin  DOB: 02/26/1951    Initial Comment Caller believes she has a virus, just finished an antibiotic, is congested and has a bad headache, and has pain in her left shoulder blade.    Nurse Assessment  Nurse: Adella Nissen, RN, Leitha Schuller Date/Time ([place] Time): 09/02/2016 3:17:31 PM  Confirm and document reason for call. If symptomatic, describe symptoms. ---Caller states just finished antibiotic for sinus infection, now congested heavily again, did have a fever yesterday, but none today, clear drainage mostly now, headache, ache in face is gone after antibiotic, pain up under left shoulder blade, hurts it to move, coughing a lot, coughing better today, still has phlegm in chest  Does the patient have any new or worsening symptoms? ---Yes  Will a triage be completed? ---Yes  Related visit to physician within the last 2 weeks? ---No  Does the PT have any chronic conditions? (i.e. diabetes, asthma, etc.) ---No  Is this a behavioral health or substance abuse call? ---No     Guidelines    Guideline Title Affirmed Question Affirmed Notes  Headache [1] MODERATE headache (e.g., interferes with normal activities) AND [2] present > 24 hours AND [3] unexplained (Exceptions: analgesics not tried, typical migraine, or headache part of viral illness)    Final Disposition User   See Physician within [place], RN, Leitha Schuller    Comments  Caller states pain is about a six.  Caller states no temp today, headache and body aches the most troublesome symptom today. Caller states some vomiting yesterday, but none today.   Referrals  REFERRED TO PCP OFFICE   Disagree/Comply: Comply

## 2016-09-02 NOTE — Telephone Encounter (Signed)
I will see her then  

## 2016-09-02 NOTE — Telephone Encounter (Signed)
Pt has appt with Dr Glori Bickers on 09/03/16 at 8:45.

## 2016-09-03 ENCOUNTER — Ambulatory Visit (INDEPENDENT_AMBULATORY_CARE_PROVIDER_SITE_OTHER): Payer: No Typology Code available for payment source | Admitting: Family Medicine

## 2016-09-03 ENCOUNTER — Encounter: Payer: Self-pay | Admitting: Family Medicine

## 2016-09-03 ENCOUNTER — Ambulatory Visit (INDEPENDENT_AMBULATORY_CARE_PROVIDER_SITE_OTHER)
Admission: RE | Admit: 2016-09-03 | Discharge: 2016-09-03 | Disposition: A | Discharge status: Home / Self Care | Payer: No Typology Code available for payment source | Source: Ambulatory Visit | Attending: Family Medicine | Admitting: Family Medicine

## 2016-09-03 ENCOUNTER — Ambulatory Visit: Payer: No Typology Code available for payment source | Admitting: Family Medicine

## 2016-09-03 VITALS — BP 90/62 | HR 72 | Temp 98.2°F | Wt 129.5 lb

## 2016-09-03 DIAGNOSIS — R05 Cough: Secondary | ICD-10-CM

## 2016-09-03 DIAGNOSIS — J0110 Acute frontal sinusitis, unspecified: Secondary | ICD-10-CM

## 2016-09-03 DIAGNOSIS — R058 Other specified cough: Secondary | ICD-10-CM | POA: Insufficient documentation

## 2016-09-03 DIAGNOSIS — J019 Acute sinusitis, unspecified: Secondary | ICD-10-CM | POA: Insufficient documentation

## 2016-09-03 MED ORDER — AZITHROMYCIN 250 MG PO TABS: ORAL_TABLET | ORAL | 0 refills | Status: DC

## 2016-09-03 MED ORDER — PROMETHAZINE HCL 25 MG PO TABS
25.0000 mg | ORAL_TABLET | Freq: Three times a day (TID) | ORAL | 0 refills | Status: DC | PRN
Start: 2016-09-03 — End: 2017-06-20

## 2016-09-03 NOTE — Assessment & Plan Note (Signed)
Was initially tx with minocycline  Worse again tx with zpak -in pcn all pt  Also cxr in light of back pain  Phenergan for nausea with warning of sedation  Fluid rehydration  Disc symptomatic care - see instructions on AVS  Update if not starting to improve in a week or if worsening

## 2016-09-03 NOTE — Patient Instructions (Signed)
For nausea- phenergan - it will make you sleepy  Really focus on re hydration with sips of clear fluids Use tylenol instead of ibuprofen due to your stomach symptoms  Take the zithromax for sinus infection  Chest xray today - we will have results later today

## 2016-09-03 NOTE — Progress Notes (Signed)
Subjective:    Patient ID: Bonita Giordani, female    DOB: 28-Feb-1951, 65 y.o.   MRN: [id number]  HPI Here with uri symptoms   Cold 3 wk ago  Then abx for sinusitis - improved for 24 hours (minocycline)  Urgent care   Then feeling awful again  Fever at home- 101 max  Also vomiting = tues nt and wed -now just nauseated No diarrhea   Ears feel ok  Throat is ok  Still coughing -- productive (clear to white) - better than it was  Has a headache - across her forehead   Taking ibuprofen over the counter (not taking with food)  occ cracker   Pain under L shoulder blade when she coughs    Patient Active Problem List   Diagnosis Date Noted  . Acute sinusitis 09/03/2016  . Productive cough 09/03/2016  . Pulmonary nodules 11/21/2015  . Colon cancer screening 11/21/2015  . Abdominal pain 08/29/2015  . Need for hepatitis C screening test 08/29/2015  . Thoracic back pain 08/13/2013  . Right knee pain 08/13/2013  . Baker's cyst of knee 08/13/2013  . Near syncope 10/12/2012  . Syncope 09/25/2012  . Hypotension 09/25/2012  . Thoracic sprain 09/08/2012  . VERTIGO 01/17/2009   Past Medical History:  Diagnosis Date  . Anemia   . Blood transfusion without reported diagnosis   . Depression    grief reaction  . Endometriosis   . Hypotension    per pt she has fainted from low b/p in the past   Past Surgical History:  Procedure Laterality Date  . ABLATION ON ENDOMETRIOSIS    . COLONOSCOPY    . ruptured ovarian cyst    . WISDOM TOOTH EXTRACTION     Social History  Substance Use Topics  . Smoking status: Never Smoker  . Smokeless tobacco: Never Used  . Alcohol use 0.0 oz/week     Comment: rare   Family History  Problem Relation Age of Onset  . Cancer Maternal Grandmother     ovarian  . Depression Sister   . Colon cancer Neg Hx   . Esophageal cancer Neg Hx   . Pancreatic cancer Neg Hx   . Rectal cancer Neg Hx   . Stomach cancer Neg Hx    Allergies  Allergen  Reactions  . Penicillins Swelling    Throat swelling, rash   Current Outpatient Prescriptions on File Prior to Visit  Medication Sig Dispense Refill  . citalopram (CELEXA) 20 MG tablet Take 20 mg by mouth daily.    . midodrine (PROAMATINE) 5 MG tablet Take 1 tablet (5 mg total) by mouth 3 (three) times daily as needed (for low blood pressure symptoms). 30 tablet 11   Current Facility-Administered Medications on File Prior to Visit  Medication Dose Route Frequency Provider Last Rate Last Dose  . 0.9 %  sodium chloride infusion  500 mL Intravenous Continuous Ladene Artist, MD        Review of Systems  Constitutional: Positive for appetite change and fatigue. Negative for fever.  HENT: Positive for congestion, postnasal drip, rhinorrhea, sinus pressure, sneezing and sore throat. Negative for ear pain.   Eyes: Negative for pain and discharge.  Respiratory: Positive for cough. Negative for shortness of breath, wheezing and stridor.   Cardiovascular: Negative for chest pain.  Gastrointestinal: Positive for nausea and vomiting. Negative for diarrhea.  Genitourinary: Negative for frequency, hematuria and urgency.  Musculoskeletal: Negative for arthralgias and myalgias.  Skin: Negative for rash.  Neurological: Positive for headaches. Negative for dizziness, weakness and light-headedness.  Psychiatric/Behavioral: Negative for confusion and dysphoric mood.       Objective:   Physical Exam  Constitutional: She appears well-developed and well-nourished. No distress.  Fatigued appearing   HENT:  Head: Normocephalic and atraumatic.  Right Ear: External ear normal.  Left Ear: External ear normal.  Mouth/Throat: Oropharynx is clear and moist. No oropharyngeal exudate.  Nares are injected and congested  Bilateral maxillary and frontal sinus tenderness  Post nasal drip   Eyes: Conjunctivae and EOM are normal. Pupils are equal, round, and reactive to light. Right eye exhibits no discharge. Left  eye exhibits no discharge.  Neck: Normal range of motion. Neck supple.  Cardiovascular: Normal rate and regular rhythm.   Pulmonary/Chest: Effort normal and breath sounds normal. No respiratory distress. She has no wheezes. She has no rales.  Abdominal: Soft. Bowel sounds are normal. She exhibits no distension and no mass. There is no tenderness. There is no rebound and no guarding.  Lymphadenopathy:    She has no cervical adenopathy.  Neurological: She is alert. No cranial nerve deficit.  Skin: Skin is warm and dry. No rash noted.  Psychiatric: She has a normal mood and affect.          Assessment & Plan:   Problem List Items Addressed This Visit      Respiratory   Acute sinusitis    Was initially tx with minocycline  Worse again tx with zpak -in pcn all pt  Also cxr in light of back pain  Phenergan for nausea with warning of sedation  Fluid rehydration  Disc symptomatic care - see instructions on AVS  Update if not starting to improve in a week or if worsening        Relevant Medications   azithromycin (ZITHROMAX Z-PAK) 250 MG tablet   promethazine (PHENERGAN) 25 MG tablet     Other   Productive cough    cxr for this and pain under L shoulder blade (3 wk)  Lung exam is re assuring       Relevant Orders   DG Chest 2 View (Completed)

## 2016-09-03 NOTE — Assessment & Plan Note (Signed)
cxr for this and pain under L shoulder blade (3 wk)  Lung exam is re assuring

## 2016-09-03 NOTE — Progress Notes (Signed)
Pre visit review using our clinic review tool, if applicable. No additional management support is needed unless otherwise documented below in the visit note. 

## 2017-06-20 ENCOUNTER — Ambulatory Visit (INDEPENDENT_AMBULATORY_CARE_PROVIDER_SITE_OTHER): Payer: No Typology Code available for payment source | Admitting: Family Medicine

## 2017-06-20 ENCOUNTER — Ambulatory Visit (INDEPENDENT_AMBULATORY_CARE_PROVIDER_SITE_OTHER)
Admission: RE | Admit: 2017-06-20 | Discharge: 2017-06-20 | Disposition: A | Discharge status: Home / Self Care | Payer: Medicare Other | Source: Ambulatory Visit | Attending: Family Medicine | Admitting: Family Medicine

## 2017-06-20 ENCOUNTER — Encounter: Payer: Self-pay | Admitting: Family Medicine

## 2017-06-20 VITALS — BP 94/62 | HR 87 | Temp 97.7°F | Ht 66.0 in | Wt 124.2 lb

## 2017-06-20 DIAGNOSIS — G8929 Other chronic pain: Secondary | ICD-10-CM

## 2017-06-20 DIAGNOSIS — I951 Orthostatic hypotension: Secondary | ICD-10-CM | POA: Diagnosis not present

## 2017-06-20 DIAGNOSIS — M545 Low back pain, unspecified: Secondary | ICD-10-CM | POA: Insufficient documentation

## 2017-06-20 DIAGNOSIS — M51369 Other intervertebral disc degeneration, lumbar region without mention of lumbar back pain or lower extremity pain: Secondary | ICD-10-CM

## 2017-06-20 DIAGNOSIS — M5136 Other intervertebral disc degeneration, lumbar region: Secondary | ICD-10-CM

## 2017-06-20 HISTORY — DX: Other intervertebral disc degeneration, lumbar region: M51.36

## 2017-06-20 HISTORY — DX: Other intervertebral disc degeneration, lumbar region without mention of lumbar back pain or lower extremity pain: M51.369

## 2017-06-20 MED ORDER — FLUDROCORTISONE ACETATE 0.1 MG PO TABS: 0.1000 mg | ORAL_TABLET | Freq: Every day | ORAL | 11 refills | Status: DC

## 2017-06-20 NOTE — Patient Instructions (Signed)
Start florinef one pill daily  If any issues let us know  Follow up in 3-4 weeks   Xray of back now  Will likely recommend PT- depending on how it looks Use heat to relax muscles  Keep stretching

## 2017-06-20 NOTE — Progress Notes (Signed)
Subjective:    Patient ID: Christie Lin, female    DOB: Feb 20, 1951, 66 y.o.   MRN: [id number]  HPI Here for concerns about low bp and also back pain (no urinary symptoms)  Wt Readings from Last 3 Encounters:  06/20/17 124 lb 4 oz (56.4 kg)  09/03/16 129 lb 8 oz (58.7 kg)  03/23/16 134 lb (60.8 kg)  lost 10 lb in the past year  20.05 kg/m  BP Readings from Last 3 Encounters:  06/20/17 94/62  09/03/16 90/62  06/02/16 (!) 106/59   Hypotension is more frequent  Also symptomatic  Makes her dizzy  It "wipes her out" totally -has to occ lie down in bed for several hours  Has had to stay in her hotel room   No syncope (just near syncope)   She forgets she has the minodrine (has it)    Pulse Readings from Last 3 Encounters:  06/20/17 87  09/03/16 72  06/02/16 64   She is getting a good amount of fluid and salt  Just returned from [place] - and perhaps she did not get as much sodium    Leaving for [place] in 6 weeks again/ work   Lower back bothers her - has been bothering her  Takes ibuprofen  Going on for a year No known injury or trauma  Both sides - a little more on the left  Does not shoot down leg  Bending forward helps a bit  Leaning back makes it worse  Worse sitting-has to get up and move around  Shopping bothers her  No numbness or weakness of feet  No change in b/b habits  No foot drop  Tries to wear good foot wear   Patient Active Problem List   Diagnosis Date Noted  . Low back pain 06/20/2017  . Productive cough 09/03/2016  . Pulmonary nodules 11/21/2015  . Colon cancer screening 11/21/2015  . Abdominal pain 08/29/2015  . Need for hepatitis C screening test 08/29/2015  . Thoracic back pain 08/13/2013  . Right knee pain 08/13/2013  . Baker's cyst of knee 08/13/2013  . Near syncope 10/12/2012  . Syncope 09/25/2012  . Hypotension 09/25/2012  . Thoracic sprain 09/08/2012  . VERTIGO 01/17/2009   Past Medical History:  Diagnosis Date  . Anemia    . Blood transfusion without reported diagnosis   . Depression    grief reaction  . Endometriosis   . Hypotension    per pt she has fainted from low b/p in the past   Past Surgical History:  Procedure Laterality Date  . ABLATION ON ENDOMETRIOSIS    . COLONOSCOPY    . ruptured ovarian cyst    . WISDOM TOOTH EXTRACTION     Social History  Substance Use Topics  . Smoking status: Never Smoker  . Smokeless tobacco: Never Used  . Alcohol use 0.0 oz/week     Comment: rare   Family History  Problem Relation Age of Onset  . Cancer Maternal Grandmother        ovarian  . Depression Sister   . Colon cancer Neg Hx   . Esophageal cancer Neg Hx   . Pancreatic cancer Neg Hx   . Rectal cancer Neg Hx   . Stomach cancer Neg Hx    Allergies  Allergen Reactions  . Penicillins Swelling    Throat swelling, rash   Current Outpatient Prescriptions on File Prior to Visit  Medication Sig Dispense Refill  . citalopram (CELEXA) 20 MG tablet  Take 20 mg by mouth daily.     Current Facility-Administered Medications on File Prior to Visit  Medication Dose Route Frequency Provider Last Rate Last Dose  . 0.9 %  sodium chloride infusion  500 mL Intravenous Continuous Ladene Artist, MD          Review of Systems  Constitutional: Positive for fatigue. Negative for activity change, appetite change, fever and unexpected weight change.  HENT: Negative for congestion, ear pain, rhinorrhea, sinus pressure and sore throat.   Eyes: Negative for pain, redness and visual disturbance.  Respiratory: Negative for cough, shortness of breath and wheezing.   Cardiovascular: Negative for chest pain and palpitations.  Gastrointestinal: Negative for abdominal pain, blood in stool, constipation and diarrhea.  Endocrine: Negative for polydipsia and polyuria.  Genitourinary: Negative for dysuria, frequency and urgency.  Musculoskeletal: Positive for back pain. Negative for arthralgias, gait problem and myalgias.    Skin: Negative for pallor and rash.  Allergic/Immunologic: Negative for environmental allergies.  Neurological: Positive for light-headedness. Negative for dizziness, tremors, syncope, speech difficulty, weakness, numbness and headaches.  Hematological: Negative for adenopathy. Does not bruise/bleed easily.  Psychiatric/Behavioral: Negative for decreased concentration and dysphoric mood. The patient is not nervous/anxious.        Objective:   Physical Exam  Constitutional: She appears well-developed and well-nourished. No distress.  Slim and well appearing   HENT:  Head: Normocephalic and atraumatic.  Mouth/Throat: Oropharynx is clear and moist.  Eyes: Pupils are equal, round, and reactive to light. Conjunctivae and EOM are normal. No scleral icterus.  Neck: Normal range of motion. Neck supple. No JVD present. Carotid bruit is not present. No thyromegaly present.  Cardiovascular: Normal rate, regular rhythm, normal heart sounds and intact distal pulses.  Exam reveals no gallop.   Pulmonary/Chest: Effort normal and breath sounds normal. No respiratory distress. She has no wheezes. She has no rales.  No crackles  Abdominal: Soft. Bowel sounds are normal. She exhibits no distension, no abdominal bruit and no mass. There is no tenderness.  Musculoskeletal: She exhibits tenderness. She exhibits no edema.       Right shoulder: She exhibits tenderness and spasm. She exhibits normal range of motion, no bony tenderness, no swelling, no crepitus, no deformity, normal pulse and normal strength.       Lumbar back: She exhibits decreased range of motion, tenderness and spasm. She exhibits no bony tenderness and no edema.  Mild thoracolumbar scoliosis  Spasm of bilat lumbar muscle groups Nl piriformis Nl rom spine with some pain on full ext  Neg slr No neuro changes   Lymphadenopathy:    She has no cervical adenopathy.  Neurological: She is alert. She has normal strength and normal reflexes. She  displays no atrophy. No cranial nerve deficit or sensory deficit. She exhibits normal muscle tone. Coordination and gait normal.  Negative SLR  Skin: Skin is warm and dry. No rash noted. No erythema. No pallor.  Psychiatric: She has a normal mood and affect.          Assessment & Plan:   Problem List Items Addressed This Visit      Cardiovascular and Mediastinum   Hypotension - Primary    Ongoing and more symptomatic=esp with travel and wt loss  Add florinef 0.1 mg once daily  F/u 1 mo with plan for labs as well  Alert if side eff  Enc fluids/sodium  Perhaps comp hose for travel if needed  Other   Low back pain    bilat but slt worse on the L  Reassuring exam  Suspect scoliosis is causing some muscle fatigue and spasm  Will likely recommend PT       Relevant Medications   fludrocortisone (FLORINEF) 0.1 MG tablet   Other Relevant Orders   DG Lumbar Spine Complete

## 2017-06-20 NOTE — Assessment & Plan Note (Signed)
Ongoing and more symptomatic=esp with travel and wt loss  Add florinef 0.1 mg once daily  F/u 1 mo with plan for labs as well  Alert if side eff  Enc fluids/sodium  Perhaps comp hose for travel if needed

## 2017-06-20 NOTE — Assessment & Plan Note (Signed)
bilat but slt worse on the L  Reassuring exam  Suspect scoliosis is causing some muscle fatigue and spasm  Will likely recommend PT

## 2017-06-21 ENCOUNTER — Telehealth: Payer: Self-pay | Admitting: Family Medicine

## 2017-06-21 DIAGNOSIS — G8929 Other chronic pain: Secondary | ICD-10-CM

## 2017-06-21 DIAGNOSIS — M545 Low back pain: Principal | ICD-10-CM

## 2017-06-21 NOTE — Telephone Encounter (Signed)
-----   Message from Modena Nunnery, [place] sent at 06/21/2017 12:27 PM EDT ----- Spoke to pt and informed her of results and advise. States she is agreeable to referral and was advised to await a call with appt details

## 2017-06-21 NOTE — Telephone Encounter (Signed)
Ref done  Will route to [facility] 

## 2017-07-18 ENCOUNTER — Encounter: Payer: Self-pay | Admitting: Family Medicine

## 2017-07-18 ENCOUNTER — Ambulatory Visit (INDEPENDENT_AMBULATORY_CARE_PROVIDER_SITE_OTHER): Payer: Medicare Other | Admitting: Family Medicine

## 2017-07-18 VITALS — BP 106/66 | HR 62 | Temp 98.1°F | Ht 66.0 in | Wt 126.5 lb

## 2017-07-18 DIAGNOSIS — M545 Low back pain, unspecified: Secondary | ICD-10-CM

## 2017-07-18 DIAGNOSIS — I951 Orthostatic hypotension: Secondary | ICD-10-CM

## 2017-07-18 DIAGNOSIS — G8929 Other chronic pain: Secondary | ICD-10-CM

## 2017-07-18 LAB — COMPREHENSIVE METABOLIC PANEL
ALBUMIN: 4.3 g/dL (ref 3.5–5.2)
ALT: 9 U/L (ref 0–35)
AST: 19 U/L (ref 0–37)
Alkaline Phosphatase: 41 U/L (ref 39–117)
BILIRUBIN TOTAL: 0.4 mg/dL (ref 0.2–1.2)
BUN: 13 mg/dL (ref 6–23)
CO2: 32 mEq/L (ref 19–32)
Calcium: 9.6 mg/dL (ref 8.4–10.5)
Chloride: 104 mEq/L (ref 96–112)
Creatinine, Ser: 0.74 mg/dL (ref 0.40–1.20)
GFR: 83.67 mL/min (ref >60.00)
Glucose, Bld: 86 mg/dL (ref 70–99)
POTASSIUM: 4.2 meq/L (ref 3.5–5.1)
Sodium: 142 mEq/L (ref 135–145)
TOTAL PROTEIN: 7 g/dL (ref 6.0–8.3)

## 2017-07-18 NOTE — Assessment & Plan Note (Signed)
bp is improved with much clinical improvement as well  No more pre syncope or dizziness and energy level is better  cmet today  Continue florinef Continue to follow over time

## 2017-07-18 NOTE — Assessment & Plan Note (Signed)
Intermittent-overall improving  Rev xr  Scoliosis Benefits with stretching

## 2017-07-18 NOTE — Progress Notes (Signed)
Subjective:    Patient ID: Christie Lin, female    DOB: Mar 03, 1951, 66 y.o.   MRN: [id number]  HPI  Here for f/u of chronic medical problems   Last visit started florinef 0.1 mg for hypotension with orthostatic symptoms  Does feel better!  It has made a difference  No more pre syncopal episodes   More energetic Can get more done  Just got back from [place] and walked 6-7 miles per day     Wt Readings from Last 3 Encounters:  07/18/17 126 lb 8 oz (57.4 kg)  06/20/17 124 lb 4 oz (56.4 kg)  09/03/16 129 lb 8 oz (58.7 kg)  20.42 kg/m     BP Readings from Last 3 Encounters:  07/18/17 106/66  06/20/17 94/62  09/03/16 90/62    Xray was done for back pain  Dg Lumbar Spine Complete  Result Date: 06/20/2017 CLINICAL DATA:  Patient with low back pain for 1 year. History of scoliosis. EXAM: LUMBAR SPINE - COMPLETE 4+ VIEW COMPARISON:  CT abdomen pelvis 09/05/2015. FINDINGS: Leftward curvature of the lumbar spine. Preservation of the vertebral body and intervertebral disc space heights. L3-4 degenerative disc disease. SI joints are unremarkable. Stool throughout the colon. IMPRESSION: L3-4 degenerative disc disease.  No acute osseous abnormality. Electronically Signed   By: Lovey Newcomer M.D.   On: 06/20/2017 18:36   Known scoliosis  Back pain still comes and goes  Hurt some with travel  Last week in upper back - starting to settle down now  Aleve helps   Will see her gyn and get annual exam with labs -soon   Patient Active Problem List   Diagnosis Date Noted  . Low back pain 06/20/2017  . Productive cough 09/03/2016  . Pulmonary nodules 11/21/2015  . Colon cancer screening 11/21/2015  . Abdominal pain 08/29/2015  . Need for hepatitis C screening test 08/29/2015  . Thoracic back pain 08/13/2013  . Right knee pain 08/13/2013  . Baker's cyst of knee 08/13/2013  . Near syncope 10/12/2012  . Syncope 09/25/2012  . Hypotension 09/25/2012  . Thoracic sprain 09/08/2012  .  VERTIGO 01/17/2009   Past Medical History:  Diagnosis Date  . Anemia   . Blood transfusion without reported diagnosis   . Depression    grief reaction  . Endometriosis   . Hypotension    per pt she has fainted from low b/p in the past   Past Surgical History:  Procedure Laterality Date  . ABLATION ON ENDOMETRIOSIS    . COLONOSCOPY    . ruptured ovarian cyst    . WISDOM TOOTH EXTRACTION     Social History  Substance Use Topics  . Smoking status: Never Smoker  . Smokeless tobacco: Never Used  . Alcohol use 0.0 oz/week     Comment: rare   Family History  Problem Relation Age of Onset  . Cancer Maternal Grandmother        ovarian  . Depression Sister   . Colon cancer Neg Hx   . Esophageal cancer Neg Hx   . Pancreatic cancer Neg Hx   . Rectal cancer Neg Hx   . Stomach cancer Neg Hx    Allergies  Allergen Reactions  . Penicillins Swelling    Throat swelling, rash   Current Outpatient Prescriptions on File Prior to Visit  Medication Sig Dispense Refill  . citalopram (CELEXA) 20 MG tablet Take 20 mg by mouth daily.    . fludrocortisone (FLORINEF) 0.1 MG tablet Take  1 tablet (0.1 mg total) by mouth daily. 30 tablet 11   Current Facility-Administered Medications on File Prior to Visit  Medication Dose Route Frequency Provider Last Rate Last Dose  . 0.9 %  sodium chloride infusion  500 mL Intravenous Continuous Ladene Artist, MD         Review of Systems  Constitutional: Negative for activity change, appetite change, fatigue, fever and unexpected weight change.  HENT: Negative for congestion, ear pain, rhinorrhea, sinus pressure and sore throat.   Eyes: Negative for pain, redness and visual disturbance.  Respiratory: Negative for cough, shortness of breath and wheezing.   Cardiovascular: Negative for chest pain and palpitations.  Gastrointestinal: Negative for abdominal pain, blood in stool, constipation and diarrhea.  Endocrine: Negative for polydipsia and  polyuria.  Genitourinary: Negative for dysuria, frequency and urgency.  Musculoskeletal: Negative for arthralgias, back pain and myalgias.  Skin: Negative for pallor and rash.  Allergic/Immunologic: Negative for environmental allergies.  Neurological: Negative for dizziness, syncope and headaches.  Hematological: Negative for adenopathy. Does not bruise/bleed easily.  Psychiatric/Behavioral: Negative for decreased concentration and dysphoric mood. The patient is not nervous/anxious.        Objective:   Physical Exam  Constitutional: She appears well-developed and well-nourished. No distress.  Slim and well appearing   HENT:  Head: Normocephalic and atraumatic.  Mouth/Throat: Oropharynx is clear and moist.  Eyes: Pupils are equal, round, and reactive to light. Conjunctivae and EOM are normal.  Neck: Normal range of motion. Neck supple. No JVD present. Carotid bruit is not present. No thyromegaly present.  Cardiovascular: Normal rate, regular rhythm, normal heart sounds and intact distal pulses.  Exam reveals no gallop.   Pulmonary/Chest: Effort normal and breath sounds normal. No respiratory distress. She has no wheezes. She has no rales.  No crackles  Abdominal: Soft. Bowel sounds are normal. She exhibits no distension, no abdominal bruit and no mass. There is no tenderness.  Musculoskeletal: She exhibits no edema.  Lymphadenopathy:    She has no cervical adenopathy.  Neurological: She is alert. She has normal reflexes. She exhibits normal muscle tone.  Skin: Skin is warm and dry. No rash noted. No pallor.  Psychiatric: She has a normal mood and affect.          Assessment & Plan:   Problem List Items Addressed This Visit      Cardiovascular and Mediastinum   Hypotension - Primary    bp is improved with much clinical improvement as well  No more pre syncope or dizziness and energy level is better  cmet today  Continue florinef Continue to follow over time        Relevant Orders   Comprehensive metabolic panel     Other   Low back pain    Intermittent-overall improving  Rev xr  Scoliosis Benefits with stretching

## 2017-07-18 NOTE — Patient Instructions (Addendum)
When you go to GYN -please send note/labs also mammogram report and pap report (also bone density test report if you get one)   Take care of yourself  Drink lots of water and keep eating salt  Still eat a healthy diet    Labs today

## 2017-07-19 ENCOUNTER — Encounter: Payer: Self-pay | Admitting: *Deleted

## 2017-09-29 DIAGNOSIS — Z01419 Encounter for gynecological examination (general) (routine) without abnormal findings: Secondary | ICD-10-CM | POA: Diagnosis not present

## 2017-09-29 DIAGNOSIS — M545 Low back pain: Secondary | ICD-10-CM | POA: Diagnosis not present

## 2017-09-29 DIAGNOSIS — M79605 Pain in left leg: Secondary | ICD-10-CM | POA: Diagnosis not present

## 2017-09-29 DIAGNOSIS — Z0000 Encounter for general adult medical examination without abnormal findings: Secondary | ICD-10-CM | POA: Diagnosis not present

## 2017-09-29 DIAGNOSIS — Z1231 Encounter for screening mammogram for malignant neoplasm of breast: Secondary | ICD-10-CM | POA: Diagnosis not present

## 2017-10-05 DIAGNOSIS — E785 Hyperlipidemia, unspecified: Secondary | ICD-10-CM | POA: Diagnosis not present

## 2017-10-05 DIAGNOSIS — Z0000 Encounter for general adult medical examination without abnormal findings: Secondary | ICD-10-CM | POA: Diagnosis not present

## 2017-10-05 DIAGNOSIS — Z1231 Encounter for screening mammogram for malignant neoplasm of breast: Secondary | ICD-10-CM | POA: Diagnosis not present

## 2017-10-05 LAB — HM MAMMOGRAPHY

## 2017-10-10 DIAGNOSIS — M5136 Other intervertebral disc degeneration, lumbar region: Secondary | ICD-10-CM | POA: Diagnosis not present

## 2017-10-10 DIAGNOSIS — M545 Low back pain: Secondary | ICD-10-CM | POA: Diagnosis not present

## 2017-10-10 DIAGNOSIS — M4726 Other spondylosis with radiculopathy, lumbar region: Secondary | ICD-10-CM | POA: Diagnosis not present

## 2017-10-31 DIAGNOSIS — M5136 Other intervertebral disc degeneration, lumbar region: Secondary | ICD-10-CM | POA: Diagnosis not present

## 2017-10-31 DIAGNOSIS — M2560 Stiffness of unspecified joint, not elsewhere classified: Secondary | ICD-10-CM | POA: Diagnosis not present

## 2017-10-31 DIAGNOSIS — M545 Low back pain: Secondary | ICD-10-CM | POA: Diagnosis not present

## 2017-10-31 DIAGNOSIS — M6281 Muscle weakness (generalized): Secondary | ICD-10-CM | POA: Diagnosis not present

## 2017-11-04 DIAGNOSIS — M5136 Other intervertebral disc degeneration, lumbar region: Secondary | ICD-10-CM | POA: Diagnosis not present

## 2017-11-08 DIAGNOSIS — M5136 Other intervertebral disc degeneration, lumbar region: Secondary | ICD-10-CM | POA: Diagnosis not present

## 2017-11-22 DIAGNOSIS — M5136 Other intervertebral disc degeneration, lumbar region: Secondary | ICD-10-CM | POA: Diagnosis not present

## 2017-12-14 DIAGNOSIS — M5136 Other intervertebral disc degeneration, lumbar region: Secondary | ICD-10-CM | POA: Diagnosis not present

## 2017-12-29 ENCOUNTER — Ambulatory Visit (INDEPENDENT_AMBULATORY_CARE_PROVIDER_SITE_OTHER): Payer: Medicare Other | Admitting: Internal Medicine

## 2017-12-29 ENCOUNTER — Encounter: Payer: Self-pay | Admitting: Internal Medicine

## 2017-12-29 VITALS — BP 96/68 | HR 79 | Temp 98.0°F | Wt 126.0 lb

## 2017-12-29 DIAGNOSIS — J301 Allergic rhinitis due to pollen: Secondary | ICD-10-CM

## 2017-12-29 MED ORDER — METHYLPREDNISOLONE ACETATE 80 MG/ML IJ SUSP
80.0000 mg | Freq: Once | INTRAMUSCULAR | Status: AC
  Administered 2017-12-29: 80 mg via INTRAMUSCULAR

## 2017-12-29 MED ORDER — HYDROCODONE-HOMATROPINE 5-1.5 MG/5ML PO SYRP: 5.0000 mL | ORAL_SOLUTION | Freq: Three times a day (TID) | ORAL | 0 refills | Status: DC | PRN

## 2017-12-29 NOTE — Progress Notes (Signed)
HPI  Pt presents to the clinic today with c/o nasal congestion, cough and diarrhea. She reports the nasal congestion and cough have been intermittent since February. She had diarrhea Monday and Tuesday which has since resolved. She is blowing clear/yellow/green mucous out of her nose. The cough is mainly nonproductive.. She denies chest pain or shortness of breath. She denies abdominal pain, nausea, vomiting, constipation or blood in her stool. She denies fever or chills but has been fatigued. She has tried Mucinex, Nyquil and Tylenol with minimal relief. She has no history of allergies. She has had sick contacts.  Review of Systems     Past Medical History:  Diagnosis Date  . Anemia   . Blood transfusion without reported diagnosis   . Depression    grief reaction  . Endometriosis   . Hypotension    per pt she has fainted from low b/p in the past    Family History  Problem Relation Age of Onset  . Cancer Maternal Grandmother        ovarian  . Depression Sister   . Colon cancer Neg Hx   . Esophageal cancer Neg Hx   . Pancreatic cancer Neg Hx   . Rectal cancer Neg Hx   . Stomach cancer Neg Hx     Social History   Socioeconomic History  . Marital status: Single    Spouse name: Not on file  . Number of children: Not on file  . Years of education: Not on file  . Highest education level: Not on file  Occupational History  . Occupation: Pharmacist, hospital  Social Needs  . Financial resource strain: Not on file  . Food insecurity:    Worry: Not on file    Inability: Not on file  . Transportation needs:    Medical: Not on file    Non-medical: Not on file  Tobacco Use  . Smoking status: Never Smoker  . Smokeless tobacco: Never Used  Substance and Sexual Activity  . Alcohol use: Yes    Alcohol/week: 0.0 oz    Comment: rare  . Drug use: No  . Sexual activity: Not on file  Lifestyle  . Physical activity:    Days per week: Not on file    Minutes per session: Not on file  . Stress:  Not on file  Relationships  . Social connections:    Talks on phone: Not on file    Gets together: Not on file    Attends religious service: Not on file    Active member of club or organization: Not on file    Attends meetings of clubs or organizations: Not on file    Relationship status: Not on file  . Intimate partner violence:    Fear of current or ex partner: Not on file    Emotionally abused: Not on file    Physically abused: Not on file    Forced sexual activity: Not on file  Other Topics Concern  . Not on file  Social History Narrative  . Not on file    Allergies  Allergen Reactions  . Penicillins Swelling    Throat swelling, rash     Constitutional: Positive  fatigue. Denies headache, fever or abrupt weight changes.  HEENT:  Positive nasal congestion. Denies eye redness, ear pain, ringing in the ears, wax buildup, runny nose or sore throat. Respiratory: Positive cough. Denies difficulty breathing or shortness of breath.  Cardiovascular: Denies chest pain, chest tightness, palpitations or swelling in the hands  or feet.   No other specific complaints in a complete review of systems (except as listed in HPI above).  Objective:   BP 96/68   Pulse 79   Temp 98 F (36.7 C) (Oral)   Wt 126 lb (57.2 kg)   SpO2 98%   BMI 20.34 kg/m   General: Appears her stated age, well developed, well nourished in NAD. HEENT: Head: normal shape and size, no sinus tenderness noted; Ears: bilateral cerumen impaction; Nose: mucosa pinkand moist, septum midline; Throat/Mouth: + PND. Teeth present, mucosa pink and moist, no exudate noted, no lesions or ulcerations noted.  Neck:  No adenopathy noted.  Cardiovascular: Normal rate and rhythm. S1,S2 noted.  No murmur, rubs or gallops noted.  Pulmonary/Chest: Normal effort and positive vesicular breath sounds. No respiratory distress. No wheezes, rales or ronchi noted.       Assessment & Plan:   Allergic Rhinitis:  Start Claritin 10 mg  QHS x 2 weeks Flonase 2 sprays each nostril for 3 days and then as needed. 80 mg Depo IM today eRx for Hycodan for cough  RTC as needed or if symptoms persist. Webb Silversmith, NP

## 2017-12-29 NOTE — Patient Instructions (Signed)

## 2017-12-29 NOTE — Addendum Note (Signed)
Addended by: Lurlean Nanny on: 12/29/2017 04:15 PM   Modules accepted: Orders

## 2018-10-10 DIAGNOSIS — Z1239 Encounter for other screening for malignant neoplasm of breast: Secondary | ICD-10-CM | POA: Diagnosis not present

## 2018-10-10 DIAGNOSIS — M8580 Other specified disorders of bone density and structure, unspecified site: Secondary | ICD-10-CM | POA: Diagnosis not present

## 2018-10-10 DIAGNOSIS — N3946 Mixed incontinence: Secondary | ICD-10-CM | POA: Diagnosis not present

## 2018-10-10 DIAGNOSIS — Z01419 Encounter for gynecological examination (general) (routine) without abnormal findings: Secondary | ICD-10-CM | POA: Diagnosis not present

## 2018-10-24 DIAGNOSIS — M8588 Other specified disorders of bone density and structure, other site: Secondary | ICD-10-CM | POA: Diagnosis not present

## 2018-10-25 LAB — HM DEXA SCAN

## 2018-11-08 DIAGNOSIS — Z1231 Encounter for screening mammogram for malignant neoplasm of breast: Secondary | ICD-10-CM | POA: Diagnosis not present

## 2018-11-08 LAB — HM MAMMOGRAPHY

## 2019-07-03 ENCOUNTER — Ambulatory Visit (INDEPENDENT_AMBULATORY_CARE_PROVIDER_SITE_OTHER): Payer: PPO | Admitting: Internal Medicine

## 2019-07-03 ENCOUNTER — Encounter: Payer: Self-pay | Admitting: Internal Medicine

## 2019-07-03 DIAGNOSIS — R05 Cough: Secondary | ICD-10-CM

## 2019-07-03 DIAGNOSIS — R52 Pain, unspecified: Secondary | ICD-10-CM | POA: Diagnosis not present

## 2019-07-03 DIAGNOSIS — R059 Cough, unspecified: Secondary | ICD-10-CM

## 2019-07-03 NOTE — Progress Notes (Signed)
Virtual Visit via Video Note  I connected with Christie Lin on 07/03/19 at 10:45 AM EDT by a video enabled telemedicine application and verified that I am speaking with the correct person using two identifiers.  Location: Patient: Home Provider: Office   I discussed the limitations of evaluation and management by telemedicine and the availability of in person appointments. The patient expressed understanding and agreed to proceed.  History of Present Illness:  Pt reports cough and body aches. She reports the cough started 1-2 months ago. The cough is dry and non productive. She reports some discomfort with taking a deep breath but denies chest pain or shortness of breath. She denies headache, runny nose, nasal congestion, ear pain, sore throat, loss of taste or smell. She has had some body aches over the last week, but reports she woke up this morning without any pain. She denies fever, chills or body aches. She has not had sick contacts that she is aware of. She has never had seasonal allergies. She denies recent increase in physical activity or exercise. She has taken Ibuprofen and Tylenol OTC with good relief.   Past Medical History:  Diagnosis Date  . Anemia   . Blood transfusion without reported diagnosis   . Depression    grief reaction  . Endometriosis   . Hypotension    per pt she has fainted from low b/p in the past    Current Outpatient Medications  Medication Sig Dispense Refill  . citalopram (CELEXA) 20 MG tablet Take 20 mg by mouth daily.    . fludrocortisone (FLORINEF) 0.1 MG tablet Take 1 tablet (0.1 mg total) by mouth daily. 30 tablet 11  . HYDROcodone-homatropine (HYCODAN) 5-1.5 MG/5ML syrup Take 5 mLs by mouth every 8 (eight) hours as needed for cough. 120 mL 0   Current Facility-Administered Medications  Medication Dose Route Frequency Provider Last Rate Last Dose  . 0.9 %  sodium chloride infusion  500 mL Intravenous Continuous Ladene Artist, MD         Allergies  Allergen Reactions  . Penicillins Swelling    Throat swelling, rash    Family History  Problem Relation Age of Onset  . Cancer Maternal Grandmother        ovarian  . Depression Sister   . Colon cancer Neg Hx   . Esophageal cancer Neg Hx   . Pancreatic cancer Neg Hx   . Rectal cancer Neg Hx   . Stomach cancer Neg Hx     Social History   Socioeconomic History  . Marital status: Single    Spouse name: Not on file  . Number of children: Not on file  . Years of education: Not on file  . Highest education level: Not on file  Occupational History  . Occupation: Pharmacist, hospital  Social Needs  . Financial resource strain: Not on file  . Food insecurity    Worry: Not on file    Inability: Not on file  . Transportation needs    Medical: Not on file    Non-medical: Not on file  Tobacco Use  . Smoking status: Never Smoker  . Smokeless tobacco: Never Used  Substance and Sexual Activity  . Alcohol use: Yes    Alcohol/week: 0.0 standard drinks    Comment: rare  . Drug use: No  . Sexual activity: Not on file  Lifestyle  . Physical activity    Days per week: Not on file    Minutes per session: Not on file  .  Stress: Not on file  Relationships  . Social Herbalist on phone: Not on file    Gets together: Not on file    Attends religious service: Not on file    Active member of club or organization: Not on file    Attends meetings of clubs or organizations: Not on file    Relationship status: Not on file  . Intimate partner violence    Fear of current or ex partner: Not on file    Emotionally abused: Not on file    Physically abused: Not on file    Forced sexual activity: Not on file  Other Topics Concern  . Not on file  Social History Narrative  . Not on file     Constitutional: Denies fever, malaise, fatigue, headache or abrupt weight changes.  HEENT: Denies eye pain, eye redness, ear pain, ringing in the ears, wax buildup, runny nose, nasal  congestion, bloody nose, or sore throat. Respiratory: Pt reports cough. Denies difficulty breathing, shortness of breath, or sputum production.   Cardiovascular: Denies chest pain, chest tightness, palpitations or swelling in the hands or feet.  Musculoskeletal: Pt reports body aches. Denies decrease in range of motion, difficulty with gait, muscle pain or joint pain and swelling.  Skin: Denies redness, rashes, lesions or ulcercations.   No other specific complaints in a complete review of systems (except as listed in HPI above).  Observations/Objective:   Wt Readings from Last 3 Encounters:  12/29/17 126 lb (57.2 kg)  07/18/17 126 lb 8 oz (57.4 kg)  06/20/17 124 lb 4 oz (56.4 kg)    General: Appears her stated age, well developed, well nourished in NAD. Skin: No rash noted. HEENT: Throat/Mouth: No obvious hoarseness or difficulty swallowing. Pulmonary/Chest: Normal effort. No respiratory distress.  MSK: Normal ROM of upper and lower extremities. Denies body aches or muscle weakness at this time. Neurological: Alert and oriented.   BMET    Component Value Date/Time   NA 142 07/18/2017 1240   K 4.2 07/18/2017 1240   CL 104 07/18/2017 1240   CO2 32 07/18/2017 1240   GLUCOSE 86 07/18/2017 1240   BUN 13 07/18/2017 1240   CREATININE 0.74 07/18/2017 1240   CALCIUM 9.6 07/18/2017 1240    Lipid Panel  No results found for: CHOL, TRIG, HDL, CHOLHDL, VLDL, LDLCALC  CBC    Component Value Date/Time   WBC 4.3 08/29/2015 1114   RBC 4.29 08/29/2015 1114   HGB 13.3 08/29/2015 1114   HCT 39.4 08/29/2015 1114   PLT 204.0 08/29/2015 1114   MCV 92.0 08/29/2015 1114   MCHC 33.8 08/29/2015 1114   RDW 13.5 08/29/2015 1114   LYMPHSABS 1.4 08/29/2015 1114   MONOABS 0.3 08/29/2015 1114   EOSABS 0.1 08/29/2015 1114   BASOSABS 0.0 08/29/2015 1114    Hgb A1C No results found for: HGBA1C     Assessment and Plan:  Cough:  Possibly allergy related Start Claritin 10 mg PO QHS for  7-10 days Notify us if no improvement  Body Aches:  Resolved. Will monitor for now  Return precautions discussed  Follow Up Instructions:    I discussed the assessment and treatment plan with the patient. The patient was provided an opportunity to ask questions and all were answered. The patient agreed with the plan and demonstrated an understanding of the instructions.   The patient was advised to call back or seek an in-person evaluation if the symptoms worsen or if the condition fails  to improve as anticipated.    Webb Silversmith, NP

## 2019-07-03 NOTE — Patient Instructions (Signed)

## 2019-08-21 DIAGNOSIS — Z20828 Contact with and (suspected) exposure to other viral communicable diseases: Secondary | ICD-10-CM | POA: Diagnosis not present

## 2019-08-29 ENCOUNTER — Ambulatory Visit (INDEPENDENT_AMBULATORY_CARE_PROVIDER_SITE_OTHER)
Admission: RE | Admit: 2019-08-29 | Discharge: 2019-08-29 | Disposition: A | Discharge status: Home / Self Care | Payer: PPO | Source: Ambulatory Visit | Attending: Family Medicine | Admitting: Family Medicine

## 2019-08-29 ENCOUNTER — Other Ambulatory Visit: Payer: Self-pay

## 2019-08-29 ENCOUNTER — Ambulatory Visit (INDEPENDENT_AMBULATORY_CARE_PROVIDER_SITE_OTHER): Payer: PPO | Admitting: Family Medicine

## 2019-08-29 ENCOUNTER — Encounter: Payer: Self-pay | Admitting: Family Medicine

## 2019-08-29 VITALS — BP 116/70 | HR 89 | Temp 97.2°F | Ht 66.0 in | Wt 129.1 lb

## 2019-08-29 DIAGNOSIS — M549 Dorsalgia, unspecified: Secondary | ICD-10-CM | POA: Diagnosis not present

## 2019-08-29 DIAGNOSIS — R350 Frequency of micturition: Secondary | ICD-10-CM

## 2019-08-29 DIAGNOSIS — Z23 Encounter for immunization: Secondary | ICD-10-CM

## 2019-08-29 DIAGNOSIS — G8929 Other chronic pain: Secondary | ICD-10-CM

## 2019-08-29 DIAGNOSIS — M47814 Spondylosis without myelopathy or radiculopathy, thoracic region: Secondary | ICD-10-CM | POA: Diagnosis not present

## 2019-08-29 DIAGNOSIS — M545 Low back pain: Secondary | ICD-10-CM | POA: Diagnosis not present

## 2019-08-29 LAB — POC URINALSYSI DIPSTICK (AUTOMATED)
Bilirubin, UA: NEGATIVE
Blood, UA: NEGATIVE
Glucose, UA: NEGATIVE
Ketones, UA: NEGATIVE
Leukocytes, UA: NEGATIVE
Nitrite, UA: NEGATIVE
Protein, UA: NEGATIVE
Spec Grav, UA: 1.02 (ref 1.010–1.025)
Urobilinogen, UA: 0.2 E.U./dL
pH, UA: 6 (ref 5.0–8.0)

## 2019-08-29 MED ORDER — METHOCARBAMOL 500 MG PO TABS: 500.0000 mg | ORAL_TABLET | Freq: Three times a day (TID) | ORAL | 0 refills | Status: DC | PRN

## 2019-08-29 NOTE — Assessment & Plan Note (Signed)
This seems to be coming from recurrent lumbar pain  Worse with flexion and R lat bend  PT ref done TS xray ordered (fam hx of mult myeloma) Heat/stretches Methocarbamol prn  Update if no imp

## 2019-08-29 NOTE — Assessment & Plan Note (Signed)
Neg ua today  May be component of overactive bladder

## 2019-08-29 NOTE — Assessment & Plan Note (Signed)
Recurrent-now worse on R with some radicular symptoms  Reassuring exam  LS xray ordered in light of family h/o multiple myeloma  PT helped in the past-ref done Enc stretching/walking  Disc neuro symptoms to watch for  Methocarbamol px for pm use/caution of sedation

## 2019-08-29 NOTE — Progress Notes (Signed)
Subjective:    Patient ID: Christie Lin, female    DOB: Feb 26, 1951, 68 y.o.   MRN: [id number]  HPI Pt presents for back pain   Worse in the past 4-6 weeks  She is really active so this has changed her life significantly  She walks a lot for exercise   Low back pain  Worse than in the past  Shooting down the R leg / back pain is R sided  Now going up into mid back as well (then both sides equally)   Bending over is the worst  Getting up out of a chair is bad also  Cannot get up from the floor   Best when lying down  Best on side or stomach   Has done PT in the past -it did help   Left town for 6 mo and just came back She did not keep up with her PT exercises  occ numb/tingly in R foot toes  Also occ has pain in L arm with numbness (intermittent)      Last lumbar films in 2018  DG Lumbar Spine Complete (Accession [id number]) (Order [id number]) Imaging Date: 06/20/2017 Department: [facility] Released By: Ellamae Sia Authorizing: Tower, Wynelle Fanny, MD  Exam Information  Status Exam Begun  Exam Ended   Final [99] 06/20/2017 2:49 PM 06/20/2017 2:49 PM  PACS Intelerad Image Link  Show images for DG Lumbar Spine Complete  Study Result  CLINICAL DATA:  Patient with low back pain for 1 year. History of scoliosis.  EXAM: LUMBAR SPINE - COMPLETE 4+ VIEW  COMPARISON:  CT abdomen pelvis 09/05/2015.  FINDINGS: Leftward curvature of the lumbar spine. Preservation of the vertebral body and intervertebral disc space heights. L3-4 degenerative disc disease. SI joints are unremarkable. Stool throughout the colon.  IMPRESSION: L3-4 degenerative disc disease.  No acute osseous abnormality.   Electronically Signed   By: Lovey Newcomer M.D.   On: 06/20/2017 18:36   Also some urinary symptoms  Has urinary pressure when she urinates  Gets a little nauseated when bladder is full Some urgency  Some frequency (4 times in several hours when  traveling)   ua today is clear Results for orders placed or performed in visit on 08/29/19  POCT Urinalysis Dipstick (Automated)  Result Value Ref Range   Color, UA Yellow    Clarity, UA Clear    Glucose, UA Negative Negative   Bilirubin, UA Negative    Ketones, UA Negative    Spec Grav, UA 1.020 1.010 - 1.025   Blood, UA Negative    pH, UA 6.0 5.0 - 8.0   Protein, UA Negative Negative   Urobilinogen, UA 0.2 0.2 or 1.0 E.U./dL   Nitrite, UA Negative    Leukocytes, UA Negative Negative    Patient Active Problem List   Diagnosis Date Noted  . Urinary frequency 08/29/2019  . Low back pain 06/20/2017  . Productive cough 09/03/2016  . Pulmonary nodules 11/21/2015  . Colon cancer screening 11/21/2015  . Abdominal pain 08/29/2015  . Need for hepatitis C screening test 08/29/2015  . Mid back pain 08/13/2013  . Right knee pain 08/13/2013  . Baker's cyst of knee 08/13/2013  . Near syncope 10/12/2012  . Syncope 09/25/2012  . Hypotension 09/25/2012  . Thoracic sprain 09/08/2012  . VERTIGO 01/17/2009   Past Medical History:  Diagnosis Date  . Anemia   . Blood transfusion without reported diagnosis   . Depression    grief reaction  .  Endometriosis   . Hypotension    per pt she has fainted from low b/p in the past   Past Surgical History:  Procedure Laterality Date  . ABLATION ON ENDOMETRIOSIS    . COLONOSCOPY    . ruptured ovarian cyst    . WISDOM TOOTH EXTRACTION     Social History   Tobacco Use  . Smoking status: Never Smoker  . Smokeless tobacco: Never Used  Substance Use Topics  . Alcohol use: Yes    Alcohol/week: 0.0 standard drinks    Comment: rare  . Drug use: No   Family History  Problem Relation Age of Onset  . Cancer Maternal Grandmother        ovarian  . Depression Sister   . Colon cancer Neg Hx   . Esophageal cancer Neg Hx   . Pancreatic cancer Neg Hx   . Rectal cancer Neg Hx   . Stomach cancer Neg Hx    Allergies  Allergen Reactions  .  Penicillins Swelling    Throat swelling, rash   Current Outpatient Medications on File Prior to Visit  Medication Sig Dispense Refill  . citalopram (CELEXA) 20 MG tablet Take 20 mg by mouth daily.    . fludrocortisone (FLORINEF) 0.1 MG tablet Take 1 tablet (0.1 mg total) by mouth daily. 30 tablet 11   Current Facility-Administered Medications on File Prior to Visit  Medication Dose Route Frequency Provider Last Rate Last Dose  . 0.9 %  sodium chloride infusion  500 mL Intravenous Continuous Stark, Malcolm T, MD         Review of Systems  Constitutional: Negative for activity change, appetite change, fatigue, fever and unexpected weight change.  HENT: Negative for congestion, ear pain, rhinorrhea, sinus pressure and sore throat.   Eyes: Negative for pain, redness and visual disturbance.  Respiratory: Negative for cough, shortness of breath and wheezing.   Cardiovascular: Negative for chest pain and palpitations.  Gastrointestinal: Negative for abdominal pain, blood in stool, constipation and diarrhea.  Endocrine: Negative for polydipsia and polyuria.  Genitourinary: Positive for frequency and urgency. Negative for dysuria and hematuria.  Musculoskeletal: Positive for back pain. Negative for arthralgias and myalgias.  Skin: Negative for pallor and rash.  Allergic/Immunologic: Negative for environmental allergies.  Neurological: Negative for dizziness, syncope and headaches.  Hematological: Negative for adenopathy. Does not bruise/bleed easily.  Psychiatric/Behavioral: Negative for decreased concentration and dysphoric mood. The patient is not nervous/anxious.        Objective:   Physical Exam Constitutional:      General: She is not in acute distress.    Appearance: Normal appearance. She is well-developed and normal weight.     Comments: Not in distress   HENT:     Head: Normocephalic and atraumatic.  Eyes:     General: No scleral icterus.    Conjunctiva/sclera: Conjunctivae  normal.     Pupils: Pupils are equal, round, and reactive to light.  Neck:     Musculoskeletal: Normal range of motion and neck supple.  Cardiovascular:     Rate and Rhythm: Normal rate and regular rhythm.  Pulmonary:     Effort: Pulmonary effort is normal.     Breath sounds: Normal breath sounds. No wheezing or rales.  Abdominal:     General: Bowel sounds are normal. There is no distension.     Palpations: Abdomen is soft.     Tenderness: There is no abdominal tenderness.     Comments: No suprapubic tenderness or fullness     No cva tenderness   Musculoskeletal:        General: Tenderness present.     Thoracic back: She exhibits decreased range of motion. She exhibits no bony tenderness, no edema and no deformity.     Lumbar back: She exhibits decreased range of motion, tenderness and spasm. She exhibits no bony tenderness and no edema.     Right lower leg: No edema.     Left lower leg: No edema.     Comments: Mild thoracolumbar scoliosis  No spinous process tenderness  Tender in R piriformis area with some spasm   Nl rom of hips   Spine flex 70 deg with pain  Ext 10 deg Pain on R lat bend  Nl twist  Nl gait  No neuro changes   Lymphadenopathy:     Cervical: No cervical adenopathy.  Skin:    General: Skin is warm and dry.     Coloration: Skin is not pale.     Findings: No erythema or rash.  Neurological:     Mental Status: She is alert.     Cranial Nerves: No cranial nerve deficit.     Sensory: No sensory deficit.     Motor: No weakness, atrophy or abnormal muscle tone.     Coordination: Coordination normal.     Gait: Gait normal.     Deep Tendon Reflexes: Reflexes are normal and symmetric. Reflexes normal.     Comments: Negative SLR for leg or foot pain  Psychiatric:        Mood and Affect: Mood normal.           Assessment & Plan:   Problem List Items Addressed This Visit      Other   Mid back pain    This seems to be coming from recurrent lumbar pain   Worse with flexion and R lat bend  PT ref done TS xray ordered (fam hx of mult myeloma) Heat/stretches Methocarbamol prn  Update if no imp      Relevant Medications   methocarbamol (ROBAXIN) 500 MG tablet   Other Relevant Orders   DG Thoracic Spine W/Swimmers (Completed)   Ambulatory referral to Physical Therapy   POCT Urinalysis Dipstick (Automated) (Completed)   Low back pain - Primary    Recurrent-now worse on R with some radicular symptoms  Reassuring exam  LS xray ordered in light of family h/o multiple myeloma  PT helped in the past-ref done Enc stretching/walking  Disc neuro symptoms to watch for  Methocarbamol px for pm use/caution of sedation        Relevant Medications   methocarbamol (ROBAXIN) 500 MG tablet   Other Relevant Orders   DG Lumbar Spine Complete (Completed)   Ambulatory referral to Physical Therapy   Urinary frequency    Neg ua today  May be component of overactive bladder       Relevant Orders   POCT Urinalysis Dipstick (Automated) (Completed)    Other Visit Diagnoses    Need for influenza vaccination       Relevant Orders   Flu Vaccine QUAD 6+ mos PF IM (Fluarix Quad PF) (Completed)

## 2019-08-29 NOTE — Patient Instructions (Signed)
Heat/ice are ok  Stretch and walk   Try the muscle relaxer with caution of sedation  xrays now   I did a PT referral - our office will call you to set that up   Give a urine sample when you can

## 2019-09-04 ENCOUNTER — Telehealth: Payer: Self-pay

## 2019-09-04 MED ORDER — TRAMADOL HCL 50 MG PO TABS: 25.0000 mg | ORAL_TABLET | Freq: Three times a day (TID) | ORAL | 0 refills | Status: AC | PRN

## 2019-09-04 NOTE — Telephone Encounter (Signed)
I sent some tramadol This is a fairly potent pain medication-use with caution of sedation (can also constipate)  I hope this helps  Please keep me posted

## 2019-09-04 NOTE — Telephone Encounter (Signed)
Left detailed message on verified voicemail  Nothing further needed.

## 2019-09-04 NOTE — Telephone Encounter (Signed)
Pt left v/m with update since seen 08/29/19.; the methocarbamol is not helping and pt wants to know if could try different med. Pt request cb.

## 2019-09-05 ENCOUNTER — Telehealth: Payer: Self-pay

## 2019-09-05 NOTE — Telephone Encounter (Signed)
PA for Tramadol sent through Covermymeds, awaiting reply.

## 2019-09-06 NOTE — Telephone Encounter (Signed)
PA was approved, letter faxed to pharmacy so they are aware, then placed in Dr. Marliss Coots inbox to sign and send for scanning

## 2019-09-10 DIAGNOSIS — M5136 Other intervertebral disc degeneration, lumbar region: Secondary | ICD-10-CM | POA: Diagnosis not present

## 2019-09-10 DIAGNOSIS — M5441 Lumbago with sciatica, right side: Secondary | ICD-10-CM | POA: Diagnosis not present

## 2019-09-10 DIAGNOSIS — G8929 Other chronic pain: Secondary | ICD-10-CM | POA: Diagnosis not present

## 2019-09-12 DIAGNOSIS — G8929 Other chronic pain: Secondary | ICD-10-CM | POA: Diagnosis not present

## 2019-09-12 DIAGNOSIS — M5441 Lumbago with sciatica, right side: Secondary | ICD-10-CM | POA: Diagnosis not present

## 2019-09-17 DIAGNOSIS — G8929 Other chronic pain: Secondary | ICD-10-CM | POA: Diagnosis not present

## 2019-09-17 DIAGNOSIS — M5441 Lumbago with sciatica, right side: Secondary | ICD-10-CM | POA: Diagnosis not present

## 2019-10-22 DIAGNOSIS — Z20828 Contact with and (suspected) exposure to other viral communicable diseases: Secondary | ICD-10-CM | POA: Diagnosis not present

## 2019-10-24 DIAGNOSIS — Z01419 Encounter for gynecological examination (general) (routine) without abnormal findings: Secondary | ICD-10-CM | POA: Diagnosis not present

## 2019-10-24 DIAGNOSIS — N3946 Mixed incontinence: Secondary | ICD-10-CM | POA: Diagnosis not present

## 2019-10-24 DIAGNOSIS — Z7689 Persons encountering health services in other specified circumstances: Secondary | ICD-10-CM | POA: Diagnosis not present

## 2019-10-24 DIAGNOSIS — Z124 Encounter for screening for malignant neoplasm of cervix: Secondary | ICD-10-CM | POA: Diagnosis not present

## 2019-10-24 DIAGNOSIS — Z1231 Encounter for screening mammogram for malignant neoplasm of breast: Secondary | ICD-10-CM | POA: Diagnosis not present

## 2019-10-24 DIAGNOSIS — Z1151 Encounter for screening for human papillomavirus (HPV): Secondary | ICD-10-CM | POA: Diagnosis not present

## 2019-10-31 DIAGNOSIS — J1289 Other viral pneumonia: Secondary | ICD-10-CM | POA: Diagnosis not present

## 2019-10-31 DIAGNOSIS — J208 Acute bronchitis due to other specified organisms: Secondary | ICD-10-CM | POA: Diagnosis not present

## 2019-10-31 DIAGNOSIS — U071 COVID-19: Secondary | ICD-10-CM | POA: Diagnosis not present

## 2019-11-01 DIAGNOSIS — U071 COVID-19: Secondary | ICD-10-CM | POA: Diagnosis not present

## 2019-11-07 DIAGNOSIS — J208 Acute bronchitis due to other specified organisms: Secondary | ICD-10-CM | POA: Diagnosis not present

## 2019-11-07 DIAGNOSIS — U071 COVID-19: Secondary | ICD-10-CM | POA: Diagnosis not present

## 2019-11-12 DIAGNOSIS — J208 Acute bronchitis due to other specified organisms: Secondary | ICD-10-CM | POA: Diagnosis not present

## 2019-11-12 DIAGNOSIS — U071 COVID-19: Secondary | ICD-10-CM | POA: Diagnosis not present

## 2020-01-24 DIAGNOSIS — Z1231 Encounter for screening mammogram for malignant neoplasm of breast: Secondary | ICD-10-CM | POA: Diagnosis not present

## 2020-01-24 LAB — HM MAMMOGRAPHY

## 2020-02-26 ENCOUNTER — Other Ambulatory Visit: Payer: Self-pay

## 2020-02-26 ENCOUNTER — Ambulatory Visit: Payer: PPO | Admitting: Dermatology

## 2020-02-26 DIAGNOSIS — L821 Other seborrheic keratosis: Secondary | ICD-10-CM | POA: Diagnosis not present

## 2020-02-26 DIAGNOSIS — L853 Xerosis cutis: Secondary | ICD-10-CM | POA: Diagnosis not present

## 2020-02-26 DIAGNOSIS — L820 Inflamed seborrheic keratosis: Secondary | ICD-10-CM

## 2020-02-26 NOTE — Patient Instructions (Addendum)
Dry Skin Care  What causes dry skin?  Dry skin is common and results from inadequate moisture in the outer skin layers. Dry skin usually results from the excessive loss of moisture from the skin surface. This occurs due to two major factors: 1. Normally the skin's oil glands deposit a layer of oil on the skin's surface. This layer of oil prevents the loss of moisture from the skin. Exposure to soaps, cleaners, solvents, and disinfectants removes this oily film, allowing water to escape. 2. Water loss from the skin increases when the humidity is low. During winter months we spend a lot of time indoors where the air is heated. Heated air has very low humidity. This also contributes to dry skin.  A tendency for dry skin may accompany such disorders as eczema. Also, as people age, the number of functioning oil glands decreases, and the tendency toward dry skin can be a sensation of skin tightness when emerging from the shower.  How do I manage dry skin?  1. Humidify your environment. This can be accomplished by using a humidifier in your bedroom at night during winter months. 2. Bathing can actually put moisture back into your skin if done right. Take the following steps while bathing to sooth dry skin:  Avoid hot water, which only dries the skin and makes itching worse. Use warm water.  Avoid washcloths or extensive rubbing or scrubbing.  Use mild soaps like unscented Dove, Oil of Olay, Cetaphil, Basis, or CeraVe.  If you take baths rather than showers, rinse off soap residue with clean water before getting out of tub.  Once out of the shower/tub, pat dry gently with a soft towel. Leave your skin damp.  While still damp, apply any medicated ointment/cream you were prescribed to the affected areas. After you apply your medicated ointment/cream, then apply your moisturizer to your whole body.This is the most important step in dry skin care. If this is omitted, your skin will continue to be  dry.  The choice of moisturizer is also very important. In general, lotion will not provider enough moisture to severely dry skin because it is water based. You should use an ointment or cream. Moisturizers should also be unscented. Good choices include Vaseline (plain petrolatum), Aquaphor, Cetaphil, CeraVe, Vanicream, DML Forte, Aveeno moisture, or Eucerin Cream.  Bath oils can be helpful, but do not replace the application of moisturizer after the bath. In addition, they make the tub slippery causing an increased risk for falls. Therefore, we do not recommend their use.    Seborrheic Keratosis  What causes seborrheic keratoses? Seborrheic keratoses are harmless, common skin growths that first appear during adult life.  As time goes by, more growths appear.  Some people may develop a large number of them.  Seborrheic keratoses appear on both covered and uncovered body parts.  They are not caused by sunlight.  The tendency to develop seborrheic keratoses can be inherited.  They vary in color from skin-colored to gray, brown, or even black.  They can be either smooth or have a rough, warty surface.   Seborrheic keratoses are superficial and look as if they were stuck on the skin.  Under the microscope this type of keratosis looks like layers upon layers of skin.  That is why at times the top layer may seem to fall off, but the rest of the growth remains and re-grows.    Treatment Seborrheic keratoses do not need to be treated, but can easily be removed in   the office.  Seborrheic keratoses often cause symptoms when they rub on clothing or jewelry.  Lesions can be in the way of shaving.  If they become inflamed, they can cause itching, soreness, or burning.  Removal of a seborrheic keratosis can be accomplished by freezing, burning, or surgery. If any spot bleeds, scabs, or grows rapidly, please return to have it checked, as these can be an indication of a skin cancer. 

## 2020-02-26 NOTE — Progress Notes (Signed)
   Follow-Up Visit   Subjective  Christie Lin is a 69 y.o. female who presents for the following: flaky skin (that comes and goes on the B/L lower legs and arms. Patient has photos and would like to discuss treatment options) and lesions (on the L med canthus and forehead - irritating, patient picks at them).  The following portions of the chart were reviewed this encounter and updated as appropriate:     Review of Systems:  No other skin or systemic complaints except as noted in HPI or Assessment and Plan.  Objective  Well appearing patient in no apparent distress; mood and affect are within normal limits.  A focused examination was performed including the face, arms, and legs. Relevant physical exam findings are noted in the Assessment and Plan.  Objective  L med canthus, mid forehead (2): Erythematous keratotic or waxy stuck-on papule or plaque.   Objective  B/L leg: Dry skin   Assessment & Plan  Inflamed seborrheic keratosis (2) L med canthus, mid forehead  Destruction of lesion - L med canthus, mid forehead  Destruction method: cryotherapy   Informed consent: discussed and consent obtained   Lesion destroyed using liquid nitrogen: Yes   Region frozen until ice ball extended beyond lesion: Yes   Outcome: patient tolerated procedure well with no complications   Post-procedure details: wound care instructions given    Xerosis cutis B/L leg  Severe -  Recommend Amlactin moisturizers, Eucerin Roughness Relief, Gold Bond Rough and Bumpy, or CeraVe Renewing SA cream BID especially after showering. Recommend mild soaps like Dove Sensitive Skin.  Seborrheic Keratoses - arms and legs - Stuck-on, waxy, white macules, papules- Discussed benign etiology and prognosis. - Observe, recommend moisturizer as above. - Call for any changes  Return if symptoms worsen or fail to improve.  Luther Redo, CMA, am acting as scribe for Brendolyn Patty, MD .  Documentation: I have  reviewed the above documentation for accuracy and completeness, and I agree with the above.  Brendolyn Patty MD

## 2020-03-10 ENCOUNTER — Ambulatory Visit (INDEPENDENT_AMBULATORY_CARE_PROVIDER_SITE_OTHER): Payer: PPO | Admitting: Family Medicine

## 2020-03-10 ENCOUNTER — Other Ambulatory Visit: Payer: Self-pay

## 2020-03-10 ENCOUNTER — Encounter: Payer: Self-pay | Admitting: Family Medicine

## 2020-03-10 ENCOUNTER — Telehealth: Payer: Self-pay

## 2020-03-10 DIAGNOSIS — S91132A Puncture wound without foreign body of left great toe without damage to nail, initial encounter: Secondary | ICD-10-CM | POA: Diagnosis not present

## 2020-03-10 DIAGNOSIS — Z23 Encounter for immunization: Secondary | ICD-10-CM | POA: Diagnosis not present

## 2020-03-10 DIAGNOSIS — S91133A Puncture wound without foreign body of unspecified great toe without damage to nail, initial encounter: Secondary | ICD-10-CM | POA: Insufficient documentation

## 2020-03-10 MED ORDER — AZITHROMYCIN 250 MG PO TABS
ORAL_TABLET | ORAL | 0 refills | Status: DC
Start: 2020-03-10 — End: 2021-02-02

## 2020-03-10 NOTE — Assessment & Plan Note (Signed)
From rusty/dirty object  Adv clean with soap and water  abx oint Cover loosely and do not submerge Cover with zpack for infection prevention  Td given today   Adv to watch for redness/swelling/pain or d/c and update

## 2020-03-10 NOTE — Patient Instructions (Addendum)
Take the zithromax as directed   Keep the wound clean with soap and water at least twice daily Antibiotic ointment is ok   Do not submerge (bath/pool) until healed  Tetanus shot today   Watch for redness/pus /swelling or more pain

## 2020-03-10 NOTE — Progress Notes (Signed)
Subjective:    Patient ID: Christie Lin, female    DOB: 06-Jun-1951, 69 y.o.   MRN: [id number]  This visit occurred during the SARS-CoV-2 public health emergency.  Safety protocols were in place, including screening questions prior to the visit, additional usage of staff PPE, and extensive cleaning of exam room while observing appropriate contact time as indicated for disinfecting solutions.    HPI Pt presents with toe injury (rusty metal)   She scraped foot on rusty metal with L great toe on outdoor furniture  It bled - but able to stop  She washed with water and used abx ointment    Last tetanus shot was Tdap 2/15   Patient Active Problem List   Diagnosis Date Noted   Puncture wound of great toe 03/10/2020   Urinary frequency 08/29/2019   Low back pain 06/20/2017   Productive cough 09/03/2016   Pulmonary nodules 11/21/2015   Colon cancer screening 11/21/2015   Abdominal pain 08/29/2015   Need for hepatitis C screening test 08/29/2015   Mid back pain 08/13/2013   Right knee pain 08/13/2013   Baker's cyst of knee 08/13/2013   Near syncope 10/12/2012   Syncope 09/25/2012   Hypotension 09/25/2012   Thoracic sprain 09/08/2012   VERTIGO 01/17/2009   Past Medical History:  Diagnosis Date   Anemia    Blood transfusion without reported diagnosis    Depression    grief reaction   Endometriosis    Hypotension    per pt she has fainted from low b/p in the past   Past Surgical History:  Procedure Laterality Date   ABLATION ON ENDOMETRIOSIS     COLONOSCOPY     ruptured ovarian cyst     WISDOM TOOTH EXTRACTION     Social History   Tobacco Use   Smoking status: Never Smoker   Smokeless tobacco: Never Used  Substance Use Topics   Alcohol use: Yes    Alcohol/week: 0.0 standard drinks    Comment: rare   Drug use: No   Family History  Problem Relation Age of Onset   Cancer Maternal Grandmother        ovarian   Depression Sister     Colon cancer Neg Hx    Esophageal cancer Neg Hx    Pancreatic cancer Neg Hx    Rectal cancer Neg Hx    Stomach cancer Neg Hx    Allergies  Allergen Reactions   Penicillins Swelling    Throat swelling, rash   Current Outpatient Medications on File Prior to Visit  Medication Sig Dispense Refill   citalopram (CELEXA) 20 MG tablet Take 20 mg by mouth every other day.      fludrocortisone (FLORINEF) 0.1 MG tablet Take 1 tablet (0.1 mg total) by mouth daily. 30 tablet 11   No current facility-administered medications on file prior to visit.     Review of Systems  Constitutional: Negative for activity change, appetite change, fatigue, fever and unexpected weight change.  HENT: Negative for congestion, ear pain, rhinorrhea, sinus pressure and sore throat.   Eyes: Negative for pain, redness and visual disturbance.  Respiratory: Negative for cough, shortness of breath and wheezing.   Cardiovascular: Negative for chest pain and palpitations.  Gastrointestinal: Negative for abdominal pain, blood in stool, constipation and diarrhea.  Endocrine: Negative for polydipsia and polyuria.  Genitourinary: Negative for dysuria, frequency and urgency.  Musculoskeletal: Negative for arthralgias, back pain and myalgias.  Skin: Positive for wound. Negative for pallor and rash.  Wound on L great toe  Allergic/Immunologic: Negative for environmental allergies.  Neurological: Negative for dizziness, syncope and headaches.  Hematological: Negative for adenopathy. Does not bruise/bleed easily.  Psychiatric/Behavioral: Negative for decreased concentration and dysphoric mood. The patient is not nervous/anxious.        Objective:   Physical Exam Constitutional:      General: She is not in acute distress.    Appearance: Normal appearance. She is not ill-appearing.  HENT:     Mouth/Throat:     Mouth: Mucous membranes are moist.  Eyes:     General: No scleral icterus.    Conjunctiva/sclera:  Conjunctivae normal.     Pupils: Pupils are equal, round, and reactive to light.  Cardiovascular:     Rate and Rhythm: Normal rate and regular rhythm.     Pulses: Normal pulses.  Pulmonary:     Effort: Pulmonary effort is normal. No respiratory distress.     Breath sounds: Normal breath sounds.  Musculoskeletal:     Cervical back: Normal range of motion and neck supple.  Skin:    General: Skin is warm and dry.     Coloration: Skin is not pale.     Findings: No erythema or rash.     Comments: 1.5 cm superficial abrasion with puncture at the end on medial L great toe  Mildly tender Not bleeding  No discharge No fb noted   Neurological:     Mental Status: She is alert.     Sensory: No sensory deficit.  Psychiatric:        Mood and Affect: Mood normal.           Assessment & Plan:   Problem List Items Addressed This Visit      Other   Puncture wound of great toe    From rusty/dirty object  Adv clean with soap and water  abx oint Cover loosely and do not submerge Cover with zpack for infection prevention  Td given today   Adv to watch for redness/swelling/pain or d/c and update       Relevant Orders   Td : Tetanus/diphtheria >7yo Preservative  free (Completed)

## 2020-03-10 NOTE — Telephone Encounter (Signed)
I will see her then  

## 2020-03-10 NOTE — Telephone Encounter (Signed)
Pt left v/m that she punctured her left big toe this morning on piece of rusty metal and cannot remember when last tetanus. Toe is throbbing but not bleeding at this time.Per immunization list last tdap was 11/01/2013.pt scheduled appt with Dr Glori Bickers today at [facility]. UC precautions given and pt voiced understanding.Pt has no covid symptoms, no travel and no known exposure to + covid.

## 2020-06-14 ENCOUNTER — Emergency Department (HOSPITAL_COMMUNITY): Payer: PPO

## 2020-06-14 ENCOUNTER — Encounter (HOSPITAL_COMMUNITY): Payer: Self-pay | Admitting: Radiology

## 2020-06-14 ENCOUNTER — Emergency Department (HOSPITAL_COMMUNITY)
Admission: EM | Admit: 2020-06-14 | Discharge: 2020-06-14 | Disposition: A | Discharge status: Home / Self Care | Payer: PPO | Attending: Emergency Medicine | Admitting: Emergency Medicine

## 2020-06-14 DIAGNOSIS — S0083XA Contusion of other part of head, initial encounter: Secondary | ICD-10-CM | POA: Insufficient documentation

## 2020-06-14 DIAGNOSIS — S0993XA Unspecified injury of face, initial encounter: Secondary | ICD-10-CM | POA: Diagnosis not present

## 2020-06-14 DIAGNOSIS — R29818 Other symptoms and signs involving the nervous system: Secondary | ICD-10-CM | POA: Diagnosis not present

## 2020-06-14 DIAGNOSIS — R29810 Facial weakness: Secondary | ICD-10-CM | POA: Diagnosis not present

## 2020-06-14 DIAGNOSIS — R220 Localized swelling, mass and lump, head: Secondary | ICD-10-CM | POA: Diagnosis not present

## 2020-06-14 DIAGNOSIS — R531 Weakness: Secondary | ICD-10-CM | POA: Diagnosis not present

## 2020-06-14 DIAGNOSIS — W19XXXA Unspecified fall, initial encounter: Secondary | ICD-10-CM | POA: Diagnosis not present

## 2020-06-14 DIAGNOSIS — R251 Tremor, unspecified: Secondary | ICD-10-CM | POA: Diagnosis not present

## 2020-06-14 DIAGNOSIS — Z79899 Other long term (current) drug therapy: Secondary | ICD-10-CM | POA: Insufficient documentation

## 2020-06-14 DIAGNOSIS — Z043 Encounter for examination and observation following other accident: Secondary | ICD-10-CM | POA: Diagnosis not present

## 2020-06-14 DIAGNOSIS — S060X0A Concussion without loss of consciousness, initial encounter: Secondary | ICD-10-CM | POA: Diagnosis not present

## 2020-06-14 DIAGNOSIS — I6503 Occlusion and stenosis of bilateral vertebral arteries: Secondary | ICD-10-CM | POA: Diagnosis not present

## 2020-06-14 DIAGNOSIS — W010XXA Fall on same level from slipping, tripping and stumbling without subsequent striking against object, initial encounter: Secondary | ICD-10-CM | POA: Insufficient documentation

## 2020-06-14 DIAGNOSIS — S199XXA Unspecified injury of neck, initial encounter: Secondary | ICD-10-CM | POA: Diagnosis not present

## 2020-06-14 DIAGNOSIS — I10 Essential (primary) hypertension: Secondary | ICD-10-CM | POA: Insufficient documentation

## 2020-06-14 DIAGNOSIS — I959 Hypotension, unspecified: Secondary | ICD-10-CM | POA: Diagnosis not present

## 2020-06-14 DIAGNOSIS — S0990XA Unspecified injury of head, initial encounter: Secondary | ICD-10-CM | POA: Diagnosis present

## 2020-06-14 LAB — COMPREHENSIVE METABOLIC PANEL
ALT: 10 U/L (ref 0–44)
AST: 21 U/L (ref 15–41)
Albumin: 3.9 g/dL (ref 3.5–5.0)
Alkaline Phosphatase: 41 U/L (ref 38–126)
Anion gap: 14 (ref 5–15)
BUN: 13 mg/dL (ref 8–23)
CO2: 20 mmol/L — ABNORMAL LOW (ref 22–32)
Calcium: 9.6 mg/dL (ref 8.9–10.3)
Chloride: 105 mmol/L (ref 98–111)
Creatinine, Ser: 0.9 mg/dL (ref 0.44–1.00)
GFR calc Af Amer: >60 mL/min (ref >60)
GFR calc non Af Amer: >60 mL/min (ref >60)
Glucose, Bld: 117 mg/dL — ABNORMAL HIGH (ref 70–99)
Potassium: 3.6 mmol/L (ref 3.5–5.1)
Sodium: 139 mmol/L (ref 135–145)
Total Bilirubin: 0.7 mg/dL (ref 0.3–1.2)
Total Protein: 6.8 g/dL (ref 6.5–8.1)

## 2020-06-14 LAB — I-STAT CHEM 8, ED
BUN: 16 mg/dL (ref 8–23)
Calcium, Ion: 1.13 mmol/L — ABNORMAL LOW (ref 1.15–1.40)
Chloride: 105 mmol/L (ref 98–111)
Creatinine, Ser: 0.8 mg/dL (ref 0.44–1.00)
Glucose, Bld: 111 mg/dL — ABNORMAL HIGH (ref 70–99)
HCT: 35 % — ABNORMAL LOW (ref 36.0–46.0)
Hemoglobin: 11.9 g/dL — ABNORMAL LOW (ref 12.0–15.0)
Potassium: 3.5 mmol/L (ref 3.5–5.1)
Sodium: 141 mmol/L (ref 135–145)
TCO2: 20 mmol/L — ABNORMAL LOW (ref 22–32)

## 2020-06-14 LAB — CBC
HCT: 37.5 % (ref 36.0–46.0)
Hemoglobin: 12.6 g/dL (ref 12.0–15.0)
MCH: 30.7 pg (ref 26.0–34.0)
MCHC: 33.6 g/dL (ref 30.0–36.0)
MCV: 91.5 fL (ref 80.0–100.0)
Platelets: 188 10*3/uL (ref 150–400)
RBC: 4.1 MIL/uL (ref 3.87–5.11)
RDW: 12.5 % (ref 11.5–15.5)
WBC: 5 10*3/uL (ref 4.0–10.5)
nRBC: 0 % (ref 0.0–0.2)

## 2020-06-14 LAB — DIFFERENTIAL
Abs Immature Granulocytes: 0.01 10*3/uL (ref 0.00–0.07)
Basophils Absolute: 0 10*3/uL (ref 0.0–0.1)
Basophils Relative: 1 %
Eosinophils Absolute: 0.1 10*3/uL (ref 0.0–0.5)
Eosinophils Relative: 1 %
Immature Granulocytes: 0 %
Lymphocytes Relative: 50 %
Lymphs Abs: 2.5 10*3/uL (ref 0.7–4.0)
Monocytes Absolute: 0.3 10*3/uL (ref 0.1–1.0)
Monocytes Relative: 7 %
Neutro Abs: 2.1 10*3/uL (ref 1.7–7.7)
Neutrophils Relative %: 41 %

## 2020-06-14 LAB — PROTIME-INR
INR: 1 (ref 0.8–1.2)
Prothrombin Time: 12.8 seconds (ref 11.4–15.2)

## 2020-06-14 LAB — APTT: aPTT: 22 seconds — ABNORMAL LOW (ref 24–36)

## 2020-06-14 LAB — CBG MONITORING, ED: Glucose-Capillary: 98 mg/dL (ref 70–99)

## 2020-06-14 LAB — ETHANOL: Alcohol, Ethyl (B): <10 mg/dL (ref <10)

## 2020-06-14 MED ORDER — LORAZEPAM 2 MG/ML IJ SOLN: 1.0000 mg | Freq: Once | INTRAMUSCULAR | Status: DC

## 2020-06-14 MED ORDER — FLUDROCORTISONE ACETATE 0.1 MG PO TABS
0.1000 mg | ORAL_TABLET | Freq: Every day | ORAL | Status: DC
  Filled 2020-06-14: qty 1

## 2020-06-14 MED ORDER — IOHEXOL 350 MG/ML SOLN
100.0000 mL | Freq: Once | INTRAVENOUS | Status: AC | PRN
  Administered 2020-06-14: 100 mL via INTRAVENOUS

## 2020-06-14 MED ORDER — SODIUM CHLORIDE 0.9 % IV BOLUS: 1000.0000 mL | Freq: Once | INTRAVENOUS | Status: DC

## 2020-06-14 NOTE — ED Notes (Signed)
Patient in MRI, spouse in room waiting for her to return.

## 2020-06-14 NOTE — ED Triage Notes (Signed)
Patient became dizzy at home, fell on Lt side of face, brusing noted to same with small laceration to Lt lip, bleeding controlled. EMS called as code stroke r/t RUE and RLE "twitching" - same noted upon arrival. Patient is AAOx4 upon arrival, follows commands. Patient able to move all extremities. Speech clear and appropriate. Code Stroke called by EMS. No prior hx of CVA. No LOC r/t fall.

## 2020-06-14 NOTE — Discharge Instructions (Signed)
Stay hydrated   Your CT and MRIs were unremarkable today   Take your florinef at home   See your doctor   Return to ER if you have another fall, tremors, weakness, numbess

## 2020-06-14 NOTE — ED Notes (Signed)
Patient transported to MRI 

## 2020-06-14 NOTE — ED Provider Notes (Signed)
[facility] EMERGENCY DEPARTMENT Provider Note   CSN: [id number] Arrival date & time: 06/14/20  2045  An emergency department physician performed an initial assessment on this suspected stroke patient at 2048.  History Chief Complaint  Patient presents with  . Cerebrovascular Accident  . Code Stroke    Christie Lin is a 69 y.o. female hx of HTN, hypotension, here presenting with fall and right-sided tremors.  Patient apparently tripped on the sidewalk and fell face forward around 8 PM.  Afterwards she states that her blood pressure was low and she feels very lightheaded whenever she stands up.  She her husband was with her and noticed that she started shaking on the right side.  Code stroke was activated by EMS.  Patient is not on any blood thinners.   The history is provided by the patient.       Past Medical History:  Diagnosis Date  . Anemia   . Blood transfusion without reported diagnosis   . Depression    grief reaction  . Endometriosis   . Hypotension    per pt she has fainted from low b/p in the past    Patient Active Problem List   Diagnosis Date Noted  . Puncture wound of great toe 03/10/2020  . Urinary frequency 08/29/2019  . Low back pain 06/20/2017  . Productive cough 09/03/2016  . Pulmonary nodules 11/21/2015  . Colon cancer screening 11/21/2015  . Abdominal pain 08/29/2015  . Need for hepatitis C screening test 08/29/2015  . Mid back pain 08/13/2013  . Right knee pain 08/13/2013  . Baker's cyst of knee 08/13/2013  . Near syncope 10/12/2012  . Syncope 09/25/2012  . Hypotension 09/25/2012  . Thoracic sprain 09/08/2012  . VERTIGO 01/17/2009    Past Surgical History:  Procedure Laterality Date  . ABLATION ON ENDOMETRIOSIS    . COLONOSCOPY    . ruptured ovarian cyst    . WISDOM TOOTH EXTRACTION       OB History   No obstetric history on file.     Family History  Problem Relation Age of Onset  . Cancer Maternal  Grandmother        ovarian  . Depression Sister   . Colon cancer Neg Hx   . Esophageal cancer Neg Hx   . Pancreatic cancer Neg Hx   . Rectal cancer Neg Hx   . Stomach cancer Neg Hx     Social History   Tobacco Use  . Smoking status: Never Smoker  . Smokeless tobacco: Never Used  Substance Use Topics  . Alcohol use: Yes    Alcohol/week: 0.0 standard drinks    Comment: rare  . Drug use: No    Home Medications Prior to Admission medications   Medication Sig Start Date End Date Taking? Authorizing Provider  azithromycin (ZITHROMAX Z-PAK) 250 MG tablet Take 2 pills by mouth today and then 1 pill daily for 4 days 03/10/20   Tower, Wynelle Fanny, MD  citalopram (CELEXA) 20 MG tablet Take 20 mg by mouth every other day.     [provider]  fludrocortisone (FLORINEF) 0.1 MG tablet Take 1 tablet (0.1 mg total) by mouth daily. 06/20/17   Tower, Wynelle Fanny, MD    Allergies    Penicillins  Review of Systems   Review of Systems  Neurological: Positive for tremors.       Tremors   All other systems reviewed and are negative.   Physical Exam Updated Vital Signs BP Marland Kitchen)  103/56   Pulse 83   Resp 20   Wt 58.2 kg   SpO2 98%   BMI 20.71 kg/m   Physical Exam Vitals and nursing note reviewed.  HENT:     Head: Normocephalic.     Comments: Bruising L face     Mouth/Throat:     Mouth: Mucous membranes are moist.  Eyes:     Extraocular Movements: Extraocular movements intact.     Pupils: Pupils are equal, round, and reactive to light.  Neck:     Comments: C collar in place  Cardiovascular:     Rate and Rhythm: Normal rate and regular rhythm.  Pulmonary:     Effort: Pulmonary effort is normal.     Breath sounds: Normal breath sounds.  Abdominal:     General: Abdomen is flat.     Palpations: Abdomen is soft.  Musculoskeletal:        General: Normal range of motion.  Skin:    General: Skin is warm.     Capillary Refill: Capillary refill takes less than 2 seconds.    Neurological:     Mental Status: She is alert.     Comments: Cranial nerves II to XII intact and there is no facial droop.  Patient seem to have some tremors on the right side that improved when she was told to hold still.  She has normal strength in bilateral upper and lower extremities.   Psychiatric:        Mood and Affect: Mood normal.        Behavior: Behavior normal.     ED Results / Procedures / Treatments   Labs (all labs ordered are listed, but only abnormal results are displayed) Labs Reviewed  APTT - Abnormal; Notable for the following components:      Result Value   aPTT 22 (*)    All other components within normal limits  COMPREHENSIVE METABOLIC PANEL - Abnormal; Notable for the following components:   CO2 20 (*)    Glucose, Bld 117 (*)    All other components within normal limits  I-STAT CHEM 8, ED - Abnormal; Notable for the following components:   Glucose, Bld 111 (*)    Calcium, Ion 1.13 (*)    TCO2 20 (*)    Hemoglobin 11.9 (*)    HCT 35.0 (*)    All other components within normal limits  ETHANOL  PROTIME-INR  CBC  DIFFERENTIAL  RAPID URINE DRUG SCREEN, HOSP PERFORMED  URINALYSIS, ROUTINE W REFLEX MICROSCOPIC  CBG MONITORING, ED    EKG None  Radiology CT ANGIO NECK W OR WO CONTRAST  Result Date: 06/14/2020 CLINICAL DATA:  69 year old female code stroke presentation, right side weakness. EXAM: CT ANGIOGRAPHY HEAD AND NECK TECHNIQUE: Multidetector CT imaging of the head and neck was performed using the standard protocol during bolus administration of intravenous contrast. Multiplanar CT image reconstructions and MIPs were obtained to evaluate the vascular anatomy. Carotid stenosis measurements (when applicable) are obtained utilizing NASCET criteria, using the distal internal carotid diameter as the denominator. CONTRAST:  133mL OMNIPAQUE IOHEXOL 350 MG/ML SOLN COMPARISON:  Plain head CT 2102 hours today. FINDINGS: CTA NECK Skeleton: Degenerative changes  throughout much of the cervical spine. Partially visible thoracic scoliosis. Left TMJ degeneration. No acute osseous abnormality identified. Upper chest: Negative. Other neck: Negative. Aortic arch: 3 vessel arch configuration. Mildly tortuous arch without atherosclerosis. Right carotid system: Negative aside from a partially retropharyngeal course of the right ICA with mild tortuosity. Left  carotid system: Negative aside from a mildly beaded appearance of the mid cervical ICA (series 8, image 97) with dolichoectasia just below the skull base (up to 7 mm diameter). Vertebral arteries: Minimal plaque at the right subclavian artery origin without stenosis. Mildly dolichoectatic proximal right vertebral artery without plaque or stenosis. More codominant appearance of the distal cervical vertebral arteries. The right vertebral is patent to the skull base. Mild plaque in the proximal left subclavian artery without stenosis. Normal left vertebral artery origin. Mildly non dominant left vertebral artery is patent and normal to the skull base. CTA HEAD Posterior circulation: Dominant right V4 segment. No distal vertebral plaque or stenosis. Normal PICA origins. Mildly fenestrated vertebrobasilar junction (normal variant). Diminutive but patent basilar artery with fetal type right PCA origin. No basilar stenosis. Patent SCA and left PCA origins. Left posterior communicating artery is diminutive. Bilateral PCA branches are within normal limits. Anterior circulation: Both ICA siphons are patent with no plaque or stenosis. Normal left posterior communicating artery origin. Normal ophthalmic artery origins. Normal carotid termini. Anterior communicating artery and bilateral ACA branches are within normal limits. Left MCA M1 segment, bifurcation, and left MCA branches are within normal limits. Right MCA M1 segment, bifurcation, and right MCA branches are within normal limits. Venous sinuses: Patent superior sagittal sinus, early  contrast timing limits the other sinuses although the dominant right transverse and sigmoid appear to be patent. Anatomic variants: Dominant right vertebral artery. Fenestrated vertebrobasilar junction. Fetal right PCA origin. Review of the MIP images confirms the above findings IMPRESSION: 1. Negative for large vessel occlusion. 2. Minimal atherosclerosis in the head and neck. No arterial stenosis identified. 3. 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 These results were communicated to Dr. Lorraine Lax at 9:39 pm on 06/14/2020 by text page via the [facility] messaging system. Electronically Signed   By: Genevie Ann M.D.   On: 06/14/2020 21:39   CT Cervical Spine Wo Contrast  Result Date: 06/14/2020 CLINICAL DATA:  Right-sided weakness, neck trauma EXAM: CT CERVICAL SPINE WITHOUT CONTRAST TECHNIQUE: Multidetector CT imaging of the cervical spine was performed without intravenous contrast. Multiplanar CT image reconstructions were also generated. COMPARISON:  None. FINDINGS: Alignment: Alignment is anatomic. Skull base and vertebrae: No acute displaced fracture. Soft tissues and spinal canal: No prevertebral fluid or swelling. No visible canal hematoma. Disc levels: There is extensive multilevel spondylosis from C3 through C7. Multilevel symmetrical neural foraminal encroachment from C3/C4 through C6/C7, greatest at the C5/C6 level. Upper chest: Airway is patent.  Lung apices are clear. Other: Reconstructed images demonstrate no additional findings. IMPRESSION: 1. No acute cervical spine fracture. 2. Extensive multilevel cervical spondylosis. Electronically Signed   By: Randa Ngo M.D.   On: 06/14/2020 21:40   CT HEAD CODE STROKE WO CONTRAST  Result Date: 06/14/2020 CLINICAL DATA:  Code stroke. 69 year old female with right side weakness. EXAM: CT HEAD WITHOUT CONTRAST TECHNIQUE: Contiguous axial images were obtained from the base of the skull through the vertex without intravenous contrast. COMPARISON:   None. FINDINGS: Brain: No midline shift, mass effect, or evidence of intracranial mass lesion. No ventriculomegaly. No acute intracranial hemorrhage identified. Cerebral volume is within normal limits for age. Gray-white matter differentiation is within normal limits throughout the brain. No cortically based acute infarct identified. Vascular: No suspicious intracranial vascular hyperdensity. Skull: Negative. Sinuses/Orbits: Negative. Other: Visualized orbits and scalp soft tissues are within normal limits. ASPECTS [facility] Stroke Program Early CT Score) Total score (0-10 with 10 being normal): 10 IMPRESSION: 1. Normal for  age non contrast CT appearance of the brain. ASPECTS 10. 2. These results were communicated to Dr. Lorraine Lax at 9:07 pm on 06/14/2020 by text page via the [facility] messaging system. Electronically Signed   By: Genevie Ann M.D.   On: 06/14/2020 21:07   CT ANGIO HEAD CODE STROKE  Result Date: 06/14/2020 CLINICAL DATA:  69 year old female code stroke presentation, right side weakness. EXAM: CT ANGIOGRAPHY HEAD AND NECK TECHNIQUE: Multidetector CT imaging of the head and neck was performed using the standard protocol during bolus administration of intravenous contrast. Multiplanar CT image reconstructions and MIPs were obtained to evaluate the vascular anatomy. Carotid stenosis measurements (when applicable) are obtained utilizing NASCET criteria, using the distal internal carotid diameter as the denominator. CONTRAST:  149mL OMNIPAQUE IOHEXOL 350 MG/ML SOLN COMPARISON:  Plain head CT 2102 hours today. FINDINGS: CTA NECK Skeleton: Degenerative changes throughout much of the cervical spine. Partially visible thoracic scoliosis. Left TMJ degeneration. No acute osseous abnormality identified. Upper chest: Negative. Other neck: Negative. Aortic arch: 3 vessel arch configuration. Mildly tortuous arch without atherosclerosis. Right carotid system: Negative aside from a partially retropharyngeal course of the right  ICA with mild tortuosity. Left carotid system: Negative aside from a mildly beaded appearance of the mid cervical ICA (series 8, image 97) with dolichoectasia just below the skull base (up to 7 mm diameter). Vertebral arteries: Minimal plaque at the right subclavian artery origin without stenosis. Mildly dolichoectatic proximal right vertebral artery without plaque or stenosis. More codominant appearance of the distal cervical vertebral arteries. The right vertebral is patent to the skull base. Mild plaque in the proximal left subclavian artery without stenosis. Normal left vertebral artery origin. Mildly non dominant left vertebral artery is patent and normal to the skull base. CTA HEAD Posterior circulation: Dominant right V4 segment. No distal vertebral plaque or stenosis. Normal PICA origins. Mildly fenestrated vertebrobasilar junction (normal variant). Diminutive but patent basilar artery with fetal type right PCA origin. No basilar stenosis. Patent SCA and left PCA origins. Left posterior communicating artery is diminutive. Bilateral PCA branches are within normal limits. Anterior circulation: Both ICA siphons are patent with no plaque or stenosis. Normal left posterior communicating artery origin. Normal ophthalmic artery origins. Normal carotid termini. Anterior communicating artery and bilateral ACA branches are within normal limits. Left MCA M1 segment, bifurcation, and left MCA branches are within normal limits. Right MCA M1 segment, bifurcation, and right MCA branches are within normal limits. Venous sinuses: Patent superior sagittal sinus, early contrast timing limits the other sinuses although the dominant right transverse and sigmoid appear to be patent. Anatomic variants: Dominant right vertebral artery. Fenestrated vertebrobasilar junction. Fetal right PCA origin. Review of the MIP images confirms the above findings IMPRESSION: 1. Negative for large vessel occlusion. 2. Minimal atherosclerosis in  the head and neck. No arterial stenosis identified. 3. 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 These results were communicated to Dr. Lorraine Lax at 9:39 pm on 06/14/2020 by text page via the [facility] messaging system. Electronically Signed   By: Genevie Ann M.D.   On: 06/14/2020 21:39   CT Maxillofacial Wo Contrast  Result Date: 06/14/2020 CLINICAL DATA:  Facial trauma, stroke EXAM: CT MAXILLOFACIAL WITHOUT CONTRAST TECHNIQUE: Multidetector CT imaging of the maxillofacial structures was performed. Multiplanar CT image reconstructions were also generated. COMPARISON:  None. FINDINGS: Osseous: No fracture or mandibular dislocation. No destructive process. Orbits: Negative. No traumatic or inflammatory finding. Sinuses: Clear. Soft tissues: Minimal soft tissue swelling left infraorbital region. Other soft tissues are unremarkable.  Limited intracranial: No significant or unexpected finding. IMPRESSION: 1. Minimal left infraorbital soft tissue swelling. No acute facial bone fracture. Electronically Signed   By: Randa Ngo M.D.   On: 06/14/2020 21:10    Procedures Procedures (including critical care time)  Medications Ordered in ED Medications  LORazepam (ATIVAN) injection 1 mg (0 mg Intravenous Hold 06/14/20 2220)  sodium chloride 0.9 % bolus 1,000 mL (has no administration in time range)  fludrocortisone (FLORINEF) tablet 0.1 mg (has no administration in time range)  iohexol (OMNIPAQUE) 350 MG/ML injection 100 mL (100 mLs Intravenous Contrast Given 06/14/20 2112)    ED Course  I have reviewed the triage vital signs and the nursing notes.  Pertinent labs & imaging results that were available during my care of the patient were reviewed by me and considered in my medical decision making (see chart for details).    MDM Rules/Calculators/A&P                         Christie Lin is a 69 y.o. female who presented with code stroke.  Patient has some involuntary movements on the right side only.   She has no right eye deviation.  Consider possible subarachnoid hemorrhage versus stroke versus anxiety.  I have low suspicion for subclinical seizures.  10:38 PM The tremors stopped. CTA showed no LVO or fracture or bleed. Dr. Lorraine Lax recommend MRI brain and if negative, can be discharged.  11:20 PM MRI brain showed no stroke. Able to ambulate. Has florinef at home for hypotension. Not hypotensive right now. Likely orthostasis vs post concussive syndrome.    Final Clinical Impression(s) / ED Diagnoses Final diagnoses:  None    Rx / DC Orders ED Discharge Orders    None       Drenda Freeze, MD 06/14/20 2321

## 2020-06-14 NOTE — Code Documentation (Signed)
Responded to Code Stroke called at 2037 for R sided weakness, [id number]. Pt arrived at 2045, NIH-1 for L facial droop. CT head negative. CTA-no LVO. Plan MRI.

## 2020-06-14 NOTE — ED Notes (Signed)
Patient reports she is not claustrophobic and thinks she will be fine in MRI. MRI made aware.

## 2020-06-15 NOTE — Consult Note (Signed)
Requesting Physician: Dr. Darl Householder    Chief Complaint: Right-sided tremors  History obtained from: Patient and Chart     HPI:                                                                                                                                       Christie Lin is a 69 y.o. female with past medical history significant for hypertension, depression, anemia presented to the emergency department as a code stroke after having a fall.  EMS was called to the scene and while assessing patient started to have jerking movements in the right arm or right leg.  Code stroke was activated and patient brought to the emergency department.  On arrival patient alert and oriented, no focal weakness.  Left side of face appears swollen, has significant facial bruising. Whole body tremors/shivering throughout.    Stat CT head was obtained which showed no acute findings.  NIH stroke scale was 0.  Patient clinically not LVO and therefore CTA not performed.  Date last known well: 06/14/20 Time last known well: 8 PM tPA Given: No, low suspicion for stroke and nondisabling NIHSS: 0  Baseline MRS 0   Past Medical History:  Diagnosis Date   Anemia    Blood transfusion without reported diagnosis    Depression    grief reaction   Endometriosis    Hypotension    per pt she has fainted from low b/p in the past    Past Surgical History:  Procedure Laterality Date   ABLATION ON ENDOMETRIOSIS     COLONOSCOPY     ruptured ovarian cyst     WISDOM TOOTH EXTRACTION      Family History  Problem Relation Age of Onset   Cancer Maternal Grandmother        ovarian   Depression Sister    Colon cancer Neg Hx    Esophageal cancer Neg Hx    Pancreatic cancer Neg Hx    Rectal cancer Neg Hx    Stomach cancer Neg Hx    Social History:  reports that she has never smoked. She has never used smokeless tobacco. She reports current alcohol use. She reports that she does not use drugs.  Allergies:   Allergies  Allergen Reactions   Penicillins Swelling    Throat swelling, rash    Medications:  I reviewed home medications   ROS:                                                                                                                                     14 systems reviewed and negative except above    Examination:                                                                                                      General: Appears well-developed  Psych: Anxious Eyes: No scleral injection HENT: No OP obstrucion Head: Normocephalic.  Cardiovascular: Normal rate and regular rhythm. Respiratory: Effort normal and breath sounds normal to anterior ascultation GI: Soft.  No distension. There is no tenderness.  Skin: WDI    Neurological Examination Mental Status: Alert, oriented, thought content appropriate.  Speech fluent without evidence of aphasia. Able to follow 3 step commands without difficulty. Cranial Nerves: II: Visual fields grossly normal,  III,IV, VI: ptosis not present, extra-ocular motions intact bilaterally, pupils equal, round, reactive to light and accommodation V,VII: smile symmetric, facial light touch sensation normal bilaterally VIII: hearing normal bilaterally IX,X: uvula rises symmetrically XI: bilateral shoulder shrug XII: midline tongue extension Motor: Right : Upper extremity   5/5    Left:     Upper extremity   5/5  Lower extremity   5/5     Lower extremity   5/5 Tone and bulk:normal tone throughout; no atrophy noted Sensory: Pinprick and light touch intact throughout, bilaterally  Plantars: Right: downgoing   Left: downgoing Cerebellar: normal finger-to-nose,     Lab Results: Basic Metabolic Panel: Recent Labs  Lab 06/14/20 2046/01/11 06/14/20 01-12-52  NA 139 141  K 3.6 3.5  CL 105 105  CO2 20*  --   GLUCOSE 117* 111*   BUN 13 16  CREATININE 0.90 0.80  CALCIUM 9.6  --     CBC: Recent Labs  Lab 06/14/20 01/11/2046 06/14/20 2053  WBC 5.0  --   NEUTROABS 2.1  --   HGB 12.6 11.9*  HCT 37.5 35.0*  MCV 91.5  --   PLT 188  --     Coagulation Studies: Recent Labs    06/14/20 Jan 11, 2046  LABPROT 12.8  INR 1.0    Imaging: CT ANGIO NECK W OR WO CONTRAST  Result Date: 06/14/2020 CLINICAL DATA:  69 year old female code stroke presentation, right side weakness. EXAM: CT ANGIOGRAPHY HEAD AND NECK TECHNIQUE: Multidetector CT imaging of the head and neck was performed using the standard protocol during  bolus administration of intravenous contrast. Multiplanar CT image reconstructions and MIPs were obtained to evaluate the vascular anatomy. Carotid stenosis measurements (when applicable) are obtained utilizing NASCET criteria, using the distal internal carotid diameter as the denominator. CONTRAST:  14mL OMNIPAQUE IOHEXOL 350 MG/ML SOLN COMPARISON:  Plain head CT 2102 hours today. FINDINGS: CTA NECK Skeleton: Degenerative changes throughout much of the cervical spine. Partially visible thoracic scoliosis. Left TMJ degeneration. No acute osseous abnormality identified. Upper chest: Negative. Other neck: Negative. Aortic arch: 3 vessel arch configuration. Mildly tortuous arch without atherosclerosis. Right carotid system: Negative aside from a partially retropharyngeal course of the right ICA with mild tortuosity. Left carotid system: Negative aside from a mildly beaded appearance of the mid cervical ICA (series 8, image 97) with dolichoectasia just below the skull base (up to 7 mm diameter). Vertebral arteries: Minimal plaque at the right subclavian artery origin without stenosis. Mildly dolichoectatic proximal right vertebral artery without plaque or stenosis. More codominant appearance of the distal cervical vertebral arteries. The right vertebral is patent to the skull base. Mild plaque in the proximal left subclavian artery  without stenosis. Normal left vertebral artery origin. Mildly non dominant left vertebral artery is patent and normal to the skull base. CTA HEAD Posterior circulation: Dominant right V4 segment. No distal vertebral plaque or stenosis. Normal PICA origins. Mildly fenestrated vertebrobasilar junction (normal variant). Diminutive but patent basilar artery with fetal type right PCA origin. No basilar stenosis. Patent SCA and left PCA origins. Left posterior communicating artery is diminutive. Bilateral PCA branches are within normal limits. Anterior circulation: Both ICA siphons are patent with no plaque or stenosis. Normal left posterior communicating artery origin. Normal ophthalmic artery origins. Normal carotid termini. Anterior communicating artery and bilateral ACA branches are within normal limits. Left MCA M1 segment, bifurcation, and left MCA branches are within normal limits. Right MCA M1 segment, bifurcation, and right MCA branches are within normal limits. Venous sinuses: Patent superior sagittal sinus, early contrast timing limits the other sinuses although the dominant right transverse and sigmoid appear to be patent. Anatomic variants: Dominant right vertebral artery. Fenestrated vertebrobasilar junction. Fetal right PCA origin. Review of the MIP images confirms the above findings IMPRESSION: 1. Negative for large vessel occlusion. 2. Minimal atherosclerosis in the head and neck. No arterial stenosis identified. 3. 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 These results were communicated to Dr. Lorraine Lax at 9:39 pm on 06/14/2020 by text page via the [facility] messaging system. Electronically Signed   By: Genevie Ann M.D.   On: 06/14/2020 21:39   CT Cervical Spine Wo Contrast  Result Date: 06/14/2020 CLINICAL DATA:  Right-sided weakness, neck trauma EXAM: CT CERVICAL SPINE WITHOUT CONTRAST TECHNIQUE: Multidetector CT imaging of the cervical spine was performed without intravenous contrast.  Multiplanar CT image reconstructions were also generated. COMPARISON:  None. FINDINGS: Alignment: Alignment is anatomic. Skull base and vertebrae: No acute displaced fracture. Soft tissues and spinal canal: No prevertebral fluid or swelling. No visible canal hematoma. Disc levels: There is extensive multilevel spondylosis from C3 through C7. Multilevel symmetrical neural foraminal encroachment from C3/C4 through C6/C7, greatest at the C5/C6 level. Upper chest: Airway is patent.  Lung apices are clear. Other: Reconstructed images demonstrate no additional findings. IMPRESSION: 1. No acute cervical spine fracture. 2. Extensive multilevel cervical spondylosis. Electronically Signed   By: Randa Ngo M.D.   On: 06/14/2020 21:40   MR BRAIN WO CONTRAST  Result Date: 06/14/2020 CLINICAL DATA:  Fall.  Suspected stroke. EXAM: MRI HEAD WITHOUT  CONTRAST TECHNIQUE: Multiplanar, multiecho pulse sequences of the brain and surrounding structures were obtained without intravenous contrast. COMPARISON:  Head CT 06/14/2020 FINDINGS: Brain: No acute infarct, acute hemorrhage or extra-axial collection. Normal white matter signal. Normal volume of CSF spaces. No chronic microhemorrhage. Normal midline structures. Vascular: Normal flow voids. Skull and upper cervical spine: Normal marrow signal. Sinuses/Orbits: Negative. Other: None. IMPRESSION: Normal brain MRI. No acute intracranial abnormality. Electronically Signed   By: Ulyses Jarred M.D.   On: 06/14/2020 23:15   CT HEAD CODE STROKE WO CONTRAST  Result Date: 06/14/2020 CLINICAL DATA:  Code stroke. 69 year old female with right side weakness. EXAM: CT HEAD WITHOUT CONTRAST TECHNIQUE: Contiguous axial images were obtained from the base of the skull through the vertex without intravenous contrast. COMPARISON:  None. FINDINGS: Brain: No midline shift, mass effect, or evidence of intracranial mass lesion. No ventriculomegaly. No acute intracranial hemorrhage identified.  Cerebral volume is within normal limits for age. Gray-white matter differentiation is within normal limits throughout the brain. No cortically based acute infarct identified. Vascular: No suspicious intracranial vascular hyperdensity. Skull: Negative. Sinuses/Orbits: Negative. Other: Visualized orbits and scalp soft tissues are within normal limits. ASPECTS [facility] Stroke Program Early CT Score) Total score (0-10 with 10 being normal): 10 IMPRESSION: 1. Normal for age non contrast CT appearance of the brain. ASPECTS 10. 2. These results were communicated to Dr. Lorraine Lax at 9:07 pm on 06/14/2020 by text page via the [facility] messaging system. Electronically Signed   By: Genevie Ann M.D.   On: 06/14/2020 21:07   CT ANGIO HEAD CODE STROKE  Result Date: 06/14/2020 CLINICAL DATA:  69 year old female code stroke presentation, right side weakness. EXAM: CT ANGIOGRAPHY HEAD AND NECK TECHNIQUE: Multidetector CT imaging of the head and neck was performed using the standard protocol during bolus administration of intravenous contrast. Multiplanar CT image reconstructions and MIPs were obtained to evaluate the vascular anatomy. Carotid stenosis measurements (when applicable) are obtained utilizing NASCET criteria, using the distal internal carotid diameter as the denominator. CONTRAST:  170mL OMNIPAQUE IOHEXOL 350 MG/ML SOLN COMPARISON:  Plain head CT 2102 hours today. FINDINGS: CTA NECK Skeleton: Degenerative changes throughout much of the cervical spine. Partially visible thoracic scoliosis. Left TMJ degeneration. No acute osseous abnormality identified. Upper chest: Negative. Other neck: Negative. Aortic arch: 3 vessel arch configuration. Mildly tortuous arch without atherosclerosis. Right carotid system: Negative aside from a partially retropharyngeal course of the right ICA with mild tortuosity. Left carotid system: Negative aside from a mildly beaded appearance of the mid cervical ICA (series 8, image 97) with dolichoectasia  just below the skull base (up to 7 mm diameter). Vertebral arteries: Minimal plaque at the right subclavian artery origin without stenosis. Mildly dolichoectatic proximal right vertebral artery without plaque or stenosis. More codominant appearance of the distal cervical vertebral arteries. The right vertebral is patent to the skull base. Mild plaque in the proximal left subclavian artery without stenosis. Normal left vertebral artery origin. Mildly non dominant left vertebral artery is patent and normal to the skull base. CTA HEAD Posterior circulation: Dominant right V4 segment. No distal vertebral plaque or stenosis. Normal PICA origins. Mildly fenestrated vertebrobasilar junction (normal variant). Diminutive but patent basilar artery with fetal type right PCA origin. No basilar stenosis. Patent SCA and left PCA origins. Left posterior communicating artery is diminutive. Bilateral PCA branches are within normal limits. Anterior circulation: Both ICA siphons are patent with no plaque or stenosis. Normal left posterior communicating artery origin. Normal ophthalmic artery origins. Normal carotid  termini. Anterior communicating artery and bilateral ACA branches are within normal limits. Left MCA M1 segment, bifurcation, and left MCA branches are within normal limits. Right MCA M1 segment, bifurcation, and right MCA branches are within normal limits. Venous sinuses: Patent superior sagittal sinus, early contrast timing limits the other sinuses although the dominant right transverse and sigmoid appear to be patent. Anatomic variants: Dominant right vertebral artery. Fenestrated vertebrobasilar junction. Fetal right PCA origin. Review of the MIP images confirms the above findings IMPRESSION: 1. Negative for large vessel occlusion. 2. Minimal atherosclerosis in the head and neck. No arterial stenosis identified. 3. 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 These results were communicated to Dr. Lorraine Lax at  9:39 pm on 06/14/2020 by text page via the [facility] messaging system. Electronically Signed   By: Genevie Ann M.D.   On: 06/14/2020 21:39   CT Maxillofacial Wo Contrast  Result Date: 06/14/2020 CLINICAL DATA:  Facial trauma, stroke EXAM: CT MAXILLOFACIAL WITHOUT CONTRAST TECHNIQUE: Multidetector CT imaging of the maxillofacial structures was performed. Multiplanar CT image reconstructions were also generated. COMPARISON:  None. FINDINGS: Osseous: No fracture or mandibular dislocation. No destructive process. Orbits: Negative. No traumatic or inflammatory finding. Sinuses: Clear. Soft tissues: Minimal soft tissue swelling left infraorbital region. Other soft tissues are unremarkable. Limited intracranial: No significant or unexpected finding. IMPRESSION: 1. Minimal left infraorbital soft tissue swelling. No acute facial bone fracture. Electronically Signed   By: Randa Ngo M.D.   On: 06/14/2020 21:10     ASSESSMENT AND PLAN  48-year female status post fall, with right-sided twitching versus tremors.  On assessment had no focal deficits with tremors.  MRI brain negative for acute stroke.  CTA negative for large vessel occlusion, may have FMD.  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

## 2020-09-27 DIAGNOSIS — R002 Palpitations: Secondary | ICD-10-CM

## 2020-09-27 HISTORY — DX: Palpitations: R00.2

## 2020-12-17 ENCOUNTER — Other Ambulatory Visit: Payer: Self-pay

## 2020-12-17 ENCOUNTER — Encounter: Payer: Self-pay | Admitting: Primary Care

## 2020-12-17 ENCOUNTER — Ambulatory Visit (INDEPENDENT_AMBULATORY_CARE_PROVIDER_SITE_OTHER): Payer: PPO | Admitting: Primary Care

## 2020-12-17 DIAGNOSIS — L989 Disorder of the skin and subcutaneous tissue, unspecified: Secondary | ICD-10-CM | POA: Diagnosis not present

## 2020-12-17 NOTE — Patient Instructions (Signed)
Follow up with the dermatologist as scheduled.  Capture images of the lesion as discussed.  It was a pleasure meeting you!

## 2020-12-17 NOTE — Assessment & Plan Note (Signed)
Acute but seems more chronic as she cannot pinpoint a duration.   Appears suspicious for skin cancer. Doesn't appear to be shingles, allergy, etc.  Recommended she see her dermatologist as scheduled. Encouraged application of sunscreen when outdoors.

## 2020-12-17 NOTE — Progress Notes (Signed)
Subjective:    Patient ID: Christie Lin, female    DOB: 1951-03-31, 70 y.o.   MRN: [id number]  HPI  Tineka Uriegas is a very pleasant 70 y.o. female patient of Dr. Glori Bickers who presents today with a chief complaint of skin lesion.  She's noticed a scaly spot to the left forehead for the last 2-4 months. Since then the spot has gradually raised, become more red, and has increased in scaly appearance. She does notice bleeding to the spot if she scratches away the scaly portion. Over the last week she's noticed the spot is much less scaly, thinks the spot is smaller.   She has an appointment scheduled for late April 2022 with her dermatologist. She's applied antifungal cream OTC without improvement.   She denies other lesions, burning, itching, pain to the site, changes in medications/soaps/detergents. She spent "25 years outside in the sun" without sunscreen during her youth.   Review of Systems  Skin:       Skin lesion         Past Medical History:  Diagnosis Date  . Anemia   . Blood transfusion without reported diagnosis   . Depression    grief reaction  . Endometriosis   . Hypotension    per pt she has fainted from low b/p in the past    Social History   Socioeconomic History  . Marital status: Single    Spouse name: Not on file  . Number of children: Not on file  . Years of education: Not on file  . Highest education level: Not on file  Occupational History  . Occupation: Pharmacist, hospital  Tobacco Use  . Smoking status: Never Smoker  . Smokeless tobacco: Never Used  Substance and Sexual Activity  . Alcohol use: Yes    Alcohol/week: 0.0 standard drinks    Comment: rare  . Drug use: No  . Sexual activity: Not on file  Other Topics Concern  . Not on file  Social History Narrative  . Not on file   Social Determinants of Health   Financial Resource Strain: Not on file  Food Insecurity: Not on file  Transportation Needs: Not on file  Physical Activity: Not on file   Stress: Not on file  Social Connections: Not on file  Intimate Partner Violence: Not on file    Past Surgical History:  Procedure Laterality Date  . ABLATION ON ENDOMETRIOSIS    . COLONOSCOPY    . ruptured ovarian cyst    . WISDOM TOOTH EXTRACTION      Family History  Problem Relation Age of Onset  . Cancer Maternal Grandmother        ovarian  . Depression Sister   . Colon cancer Neg Hx   . Esophageal cancer Neg Hx   . Pancreatic cancer Neg Hx   . Rectal cancer Neg Hx   . Stomach cancer Neg Hx     Allergies  Allergen Reactions  . Penicillins Swelling    Throat swelling, rash    Current Outpatient Medications on File Prior to Visit  Medication Sig Dispense Refill  . azithromycin (ZITHROMAX Z-PAK) 250 MG tablet Take 2 pills by mouth today and then 1 pill daily for 4 days 6 tablet 0  . citalopram (CELEXA) 20 MG tablet Take 20 mg by mouth every other day.     . fludrocortisone (FLORINEF) 0.1 MG tablet Take 1 tablet (0.1 mg total) by mouth daily. 30 tablet 11   No current facility-administered medications  on file prior to visit.    BP 110/62   Pulse 66   Temp (!) 97.5 F (36.4 C) (Temporal)   Ht 5\' 6"  (1.676 m)   Wt 125 lb (56.7 kg)   SpO2 97%   BMI 20.18 kg/m  Objective:   Physical Exam Skin:    General: Skin is warm and dry.     Findings: Lesion present.     Comments: Small, raised, slightly red, slightly scaly lesion to left forehead. See picture below.  Neurological:     Mental Status: She is alert.            Assessment & Plan:      This visit occurred during the SARS-CoV-2 public health emergency.  Safety protocols were in place, including screening questions prior to the visit, additional usage of staff PPE, and extensive cleaning of exam room while observing appropriate contact time as indicated for disinfecting solutions.

## 2021-01-14 ENCOUNTER — Ambulatory Visit: Payer: PPO | Admitting: Dermatology

## 2021-02-02 ENCOUNTER — Other Ambulatory Visit: Payer: Self-pay

## 2021-02-02 ENCOUNTER — Ambulatory Visit (INDEPENDENT_AMBULATORY_CARE_PROVIDER_SITE_OTHER): Payer: PPO | Admitting: Family Medicine

## 2021-02-02 ENCOUNTER — Encounter: Payer: Self-pay | Admitting: Family Medicine

## 2021-02-02 VITALS — BP 114/66 | HR 79 | Temp 96.9°F | Ht 66.0 in | Wt 126.5 lb

## 2021-02-02 DIAGNOSIS — R002 Palpitations: Secondary | ICD-10-CM | POA: Diagnosis not present

## 2021-02-02 DIAGNOSIS — I951 Orthostatic hypotension: Secondary | ICD-10-CM | POA: Diagnosis not present

## 2021-02-02 DIAGNOSIS — F418 Other specified anxiety disorders: Secondary | ICD-10-CM | POA: Insufficient documentation

## 2021-02-02 DIAGNOSIS — R55 Syncope and collapse: Secondary | ICD-10-CM | POA: Diagnosis not present

## 2021-02-02 LAB — CBC WITH DIFFERENTIAL/PLATELET
Basophils Absolute: 0 10*3/uL (ref 0.0–0.1)
Basophils Relative: 0.8 % (ref 0.0–3.0)
Eosinophils Absolute: 0.1 10*3/uL (ref 0.0–0.7)
Eosinophils Relative: 2.1 % (ref 0.0–5.0)
HCT: 37.8 % (ref 36.0–46.0)
Hemoglobin: 13.1 g/dL (ref 12.0–15.0)
Lymphocytes Relative: 26.2 % (ref 12.0–46.0)
Lymphs Abs: 1.1 10*3/uL (ref 0.7–4.0)
MCHC: 34.6 g/dL (ref 30.0–36.0)
MCV: 90.8 fl (ref 78.0–100.0)
Monocytes Absolute: 0.3 10*3/uL (ref 0.1–1.0)
Monocytes Relative: 7.5 % (ref 3.0–12.0)
Neutro Abs: 2.7 10*3/uL (ref 1.4–7.7)
Neutrophils Relative %: 63.4 % (ref 43.0–77.0)
Platelets: 199 10*3/uL (ref 150.0–400.0)
RBC: 4.16 Mil/uL (ref 3.87–5.11)
RDW: 13.1 % (ref 11.5–15.5)
WBC: 4.3 10*3/uL (ref 4.0–10.5)

## 2021-02-02 LAB — BASIC METABOLIC PANEL
BUN: 19 mg/dL (ref 6–23)
CO2: 32 mEq/L (ref 19–32)
Calcium: 9.7 mg/dL (ref 8.4–10.5)
Chloride: 104 mEq/L (ref 96–112)
Creatinine, Ser: 0.84 mg/dL (ref 0.40–1.20)
GFR: 71.26 mL/min (ref >60.00)
Glucose, Bld: 81 mg/dL (ref 70–99)
Potassium: 4.6 mEq/L (ref 3.5–5.1)
Sodium: 142 mEq/L (ref 135–145)

## 2021-02-02 LAB — TSH: TSH: 1.23 u[IU]/mL (ref 0.35–4.50)

## 2021-02-02 MED ORDER — FLUDROCORTISONE ACETATE 0.1 MG PO TABS
0.1000 mg | ORAL_TABLET | Freq: Every day | ORAL | 11 refills | Status: DC
Start: 2021-02-02 — End: 2022-10-15

## 2021-02-02 MED ORDER — CITALOPRAM HYDROBROMIDE 20 MG PO TABS
20.0000 mg | ORAL_TABLET | ORAL | 1 refills | Status: DC
Start: 2021-02-02 — End: 2021-11-12

## 2021-02-02 NOTE — Assessment & Plan Note (Signed)
With walking /not necessarily orthostatic See A/P for near syncope  Old cardiology notes and w/u reviewed

## 2021-02-02 NOTE — Assessment & Plan Note (Signed)
Ongoing since death of mother years ago  Px by mental health professional and then gyn  Wants to transfer to me  Takes 10 mg every other day  Wants to stay on it Has had counseling Reviewed stressors/ coping techniques/symptoms/ support sources/ tx options and side effects in detail today  No SI or red flags Good self care

## 2021-02-02 NOTE — Assessment & Plan Note (Signed)
This has returned in combination with occ palpitations (not always at the same time.  Reassuring exam.  Re started generic flornef at 0.1 mg daily and inst to watch her bp Will message [place] with response  Can then start walking short distances gradually  ER parameters discussed Lab today  Ref made to cardiology as well

## 2021-02-02 NOTE — Progress Notes (Signed)
Subjective:    Patient ID: Christie Lin, female    DOB: Oct 20, 1950, 70 y.o.   MRN: [id number]  This visit occurred during the SARS-CoV-2 public health emergency.  Safety protocols were in place, including screening questions prior to the visit, additional usage of staff PPE, and extensive cleaning of exam room while observing appropriate contact time as indicated for disinfecting solutions.    HPI Pt presents for c/o low BP after walking  Also to take over her celexa px from her gyn provider who left   Wt Readings from Last 3 Encounters:  02/02/21 126 lb 8 oz (57.4 kg)  12/17/20 125 lb (56.7 kg)  06/14/20 128 lb 4.9 oz (58.2 kg)   20.42 kg/m  Past episodes of near syncope with low bp Has seen cardiology  Did not suspect POTs at that time - Dr Rockey Situ Was px florinef 0.1 mg as needed to daily and midodrine prn   Lately -was walking 1.5 to 2 mi each am  Last week her bp got low/she got dizzy and had to lie down  Was 72/49 Happened again a different day  When it happens she can feel heart palpitations   Palpitations used to happen intermittently - now paired with above  Once in a while will wake her up at night  Feels like her heart beats hard and fast (does not check pulse)   BP Readings from Last 3 Encounters:  02/02/21 114/66  12/17/20 110/62  06/14/20 108/60   Pulse Readings from Last 3 Encounters:  02/02/21 79  12/17/20 66  06/14/20 74   She is going out of country in early [place]  No new stress   She has not been taking her florinef   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  Does not need to see gyn , saw several people with [facility]  She takes it 10 mg every other day   Her depression causes anxiety symptoms  Not down or hopeless  Mother's death started it  Has not tried stopping it  Had some counseling initially    Lab Results  Component Value Date   TSH 0.46 04/20/2012   Lab Results  Component Value Date   WBC 5.0 06/14/2020   HGB 11.9  (L) 06/14/2020   HCT 35.0 (L) 06/14/2020   MCV 91.5 06/14/2020   PLT 188 06/14/2020   Lab Results  Component Value Date   CREATININE 0.80 06/14/2020   BUN 16 06/14/2020   NA 141 06/14/2020   K 3.5 06/14/2020   CL 105 06/14/2020   CO2 20 (L) 06/14/2020   Patient Active Problem List   Diagnosis Date Noted  . Palpitations 02/02/2021  . 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 Skin lesion 12/17/2020  . Puncture wound of great toe 03/10/2020  . Urinary frequency 08/29/2019  . Low back pain 06/20/2017  . Productive cough 09/03/2016  . Pulmonary nodules 11/21/2015  . Colon cancer screening 11/21/2015  . Abdominal pain 08/29/2015  . Need for hepatitis C screening test 08/29/2015  . Mid back pain 08/13/2013  . Right knee pain 08/13/2013  . Baker's cyst of knee 08/13/2013  . Near syncope 10/12/2012  . Syncope 09/25/2012  . Hypotension 09/25/2012  . Thoracic sprain 09/08/2012  . VERTIGO 01/17/2009   Past Medical History:  Diagnosis Date  . Anemia   . Blood transfusion without reported diagnosis   . Depression    grief reaction  . Endometriosis   . Hypotension    per pt she  has fainted from low b/p in the past   Past Surgical History:  Procedure Laterality Date  . ABLATION ON ENDOMETRIOSIS    . COLONOSCOPY    . ruptured ovarian cyst    . WISDOM TOOTH EXTRACTION     Social History   Tobacco Use  . Smoking status: Never Smoker  . Smokeless tobacco: Never Used  Substance Use Topics  . Alcohol use: Yes    Alcohol/week: 0.0 standard drinks    Comment: rare  . Drug use: No   Family History  Problem Relation Age of Onset  . Cancer Maternal Grandmother        ovarian  . Depression Sister   . Colon cancer Neg Hx   . Esophageal cancer Neg Hx   . Pancreatic cancer Neg Hx   . Rectal cancer Neg Hx   . Stomach cancer Neg Hx    Allergies  Allergen Reactions  . Penicillins Swelling    Throat swelling, rash   No current outpatient medications on file prior to visit.   No  current facility-administered medications on file prior to visit.     Review of Systems  Constitutional: Negative for activity change, appetite change, fatigue, fever and unexpected weight change.  HENT: Negative for congestion, ear pain, rhinorrhea, sinus pressure and sore throat.   Eyes: Negative for pain, redness and visual disturbance.  Respiratory: Negative for cough, shortness of breath and wheezing.   Cardiovascular: Positive for palpitations. Negative for chest pain and leg swelling.  Gastrointestinal: Negative for abdominal pain, blood in stool, constipation and diarrhea.  Endocrine: Negative for polydipsia and polyuria.  Genitourinary: Negative for dysuria, frequency and urgency.  Musculoskeletal: Negative for arthralgias, back pain and myalgias.  Skin: Negative for pallor and rash.  Allergic/Immunologic: Negative for environmental allergies.  Neurological: Positive for dizziness and light-headedness. Negative for tremors, seizures, syncope, facial asymmetry, speech difficulty, weakness and headaches.  Hematological: Negative for adenopathy. Does not bruise/bleed easily.  Psychiatric/Behavioral: Negative for decreased concentration, dysphoric mood and suicidal ideas. The patient is nervous/anxious.        Objective:   Physical Exam Constitutional:      General: She is not in acute distress.    Appearance: Normal appearance. She is well-developed. She is not ill-appearing.  HENT:     Head: Normocephalic and atraumatic.  Eyes:     Conjunctiva/sclera: Conjunctivae normal.     Pupils: Pupils are equal, round, and reactive to light.  Neck:     Thyroid: No thyromegaly.     Vascular: No carotid bruit or JVD.  Cardiovascular:     Rate and Rhythm: Normal rate and regular rhythm.     Heart sounds: Normal heart sounds. No gallop.   Pulmonary:     Effort: Pulmonary effort is normal. No respiratory distress.     Breath sounds: Normal breath sounds. No wheezing or rales.   Abdominal:     General: Abdomen is flat. There is no distension or abdominal bruit.     Palpations: Abdomen is soft.  Musculoskeletal:     Cervical back: Normal range of motion and neck supple.     Right lower leg: No edema.     Left lower leg: No edema.     Comments: No LE tenderness or palp cords  Lymphadenopathy:     Cervical: No cervical adenopathy.  Skin:    General: Skin is warm and dry.     Coloration: Skin is not pale.     Findings: No erythema or  rash.  Neurological:     Mental Status: She is alert.     Cranial Nerves: No cranial nerve deficit.     Coordination: Coordination normal.     Deep Tendon Reflexes: Reflexes are normal and symmetric. Reflexes normal.  Psychiatric:        Mood and Affect: Mood normal.     Comments: Pleasant Mood is good            Assessment & Plan:   Problem List Items Addressed This Visit      Cardiovascular and Mediastinum   Hypotension    With walking /not necessarily orthostatic See A/P for near syncope  Old cardiology notes and w/u reviewed        Relevant Orders   Ambulatory referral to Cardiology   Near syncope - Primary    This has returned in combination with occ palpitations (not always at the same time.  Reassuring exam.  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

## 2021-02-02 NOTE — Patient Instructions (Signed)
Start back on the generic florinef as directed  Give it a week/watch bp and then try walking for short periods and advance as tolerated   I px your generic celexa  Take care of yourself  I placed a cardiology referral and you will get a call

## 2021-02-02 NOTE — Assessment & Plan Note (Signed)
With return of near syncope Ref to cardiology  Lab today  flornef refilled

## 2021-02-03 ENCOUNTER — Encounter: Payer: Self-pay | Admitting: *Deleted

## 2021-02-27 ENCOUNTER — Ambulatory Visit: Payer: PPO | Admitting: Cardiovascular Disease

## 2021-02-27 ENCOUNTER — Other Ambulatory Visit: Payer: Self-pay

## 2021-02-27 ENCOUNTER — Ambulatory Visit (INDEPENDENT_AMBULATORY_CARE_PROVIDER_SITE_OTHER): Payer: PPO

## 2021-02-27 ENCOUNTER — Encounter: Payer: Self-pay | Admitting: Cardiovascular Disease

## 2021-02-27 VITALS — BP 120/80 | HR 68 | Ht 66.0 in | Wt 125.5 lb

## 2021-02-27 DIAGNOSIS — R002 Palpitations: Secondary | ICD-10-CM

## 2021-02-27 DIAGNOSIS — R55 Syncope and collapse: Secondary | ICD-10-CM | POA: Diagnosis not present

## 2021-02-27 DIAGNOSIS — I951 Orthostatic hypotension: Secondary | ICD-10-CM | POA: Diagnosis not present

## 2021-02-27 MED ORDER — MIDODRINE HCL 10 MG PO TABS: 10.0000 mg | ORAL_TABLET | Freq: Three times a day (TID) | ORAL | 2 refills | Status: DC | PRN

## 2021-02-27 NOTE — Patient Instructions (Addendum)
Medication Instructions:  Midodrine 5 to 10 mg   May to three times a day as needed  Take for lightheadedness, low blood pressure  If you need a refill on your cardiac medications before your next appointment, please call your pharmacy.    Lab work: No new labs needed  Testing/Procedures: Zio monitor (palpitations)  Heart monitor (Zio patch) for 2 weeks (14 days)   Your physician has recommended that you wear a Zio monitor. This monitor is a medical device that records the heart's electrical activity. Doctors most often use these monitors to diagnose arrhythmias. Arrhythmias are problems with the speed or rhythm of the heartbeat. The monitor is a small device applied to your chest. You can wear one while you do your normal daily activities. While wearing this monitor if you have any symptoms to push the button and record what you felt. Once you have worn this monitor for the period of time provider prescribed (Usually 14 days), you will return the monitor device in the postage paid box. Once it is returned they will download the data collected and provide [place] with a report which the provider will then review and we will call you with those results. Important tips:  1. Avoid showering during the first 24 hours of wearing the monitor. 2. Avoid excessive sweating to help maximize wear time. 3. Do not submerge the device, no hot tubs, and no swimming pools. 4. Keep any lotions or oils away from the patch. 5. After 24 hours you may shower with the patch on. Take brief showers with your back facing the shower head.  6. Do not remove patch once it has been placed because that will interrupt data and decrease adhesive wear time. 7. Push the button when you have any symptoms and write down what you were feeling. 8. Once you have completed wearing your monitor, remove and place into box which has postage paid and place in your outgoing mailbox.  9. If for some reason you have misplaced your box then  call our office and we can provide another box and/or mail it off for you.   Follow-Up: At [facility], you and your health needs are our priority.  As part of our continuing mission to provide you with exceptional heart care, we have created designated Provider Care Teams.  These Care Teams include your primary Cardiologist (physician) and Advanced Practice Providers (APPs -  Physician Assistants and Nurse Practitioners) who all work together to provide you with the care you need, when you need it.  . You will need a follow up appointment as needed  . Providers on your designated Care Team:   . Murray Hodgkins, NP . Christell Faith, PA-C . Marrianne Mood, PA-C   COVID-19 Vaccine Information can be found at: [email or web address].co.uk For questions related to vaccine distribution or appointments, please email [email or web address] or call [phone].

## 2021-02-27 NOTE — Progress Notes (Signed)
Cardiology Office Note  Date:  02/27/2021   ID:  Christie Lin, DOB [birth year], MRN [id number]  PCP:  Christie Greenspan, MD   Chief Complaint  Patient presents with  . New Patient (Initial Visit)    Ref by Dr. Glori Lin for palpitations with having near syncope in the past due to decreased blood pressure. Medications reviewed by the patient verbally.     HPI:  HPI Comments: Christie Lin is a 70 year old woman with past medical history of  near syncope in 2014, seen by cardiology at that time Who presents by referral from Dr. Glori Lin for consultation of her near syncope/orthostasis  Longstanding history of near syncope, orthostasis Was previously seen 2013, had lost 10 pounds in [place] was having near syncope at that time Has been maintained on Florinef 0.1 mg daily   Recently walking, near syncope after exertion 1.5 up to 2 miles in the mornings, does not rehydrate Most of her fluids in the evenings not the mornings At the end of her walk getting dizzy, having to lay down, documented low pressures sometimes down to 70 systolic  Some palpitations, intermittent, sometimes daytime sometimes evening or overnight will wake her Denies any recent stressors Worse in the past 2 months "beating harder", other times flutter  Trip 05/2020, head trauma Mri normal  Chest CT scan 2017 no aortic atherosclerosis or coronary calcification  EKG personally reviewed by myself on todays visit Shows normal sinus rhythm rate 68 beats a minute no significant ST-T wave changes  PMH:   has a past medical history of Anemia, Blood transfusion without reported diagnosis, Depression, Endometriosis, and Hypotension.  PSH:    Past Surgical History:  Procedure Laterality Date  . ABLATION ON ENDOMETRIOSIS    . COLONOSCOPY    . ruptured ovarian cyst    . WISDOM TOOTH EXTRACTION      Current Outpatient Medications  Medication Sig Dispense Refill  . citalopram (CELEXA) 20 MG tablet Take 1 tablet (20 mg total) by  mouth every other day. 30 tablet 1  . fludrocortisone (FLORINEF) 0.1 MG tablet Take 1 tablet (0.1 mg total) by mouth daily. 30 tablet 11  . midodrine (PROAMATINE) 10 MG tablet Take 1 tablet (10 mg total) by mouth 3 (three) times daily as needed. 90 tablet 2   No current facility-administered medications for this visit.     Allergies:   Penicillins   Social History:  The patient  reports that she has never smoked. She has never used smokeless tobacco. She reports current alcohol use. She reports that she does not use drugs.   Family History:   family history includes Cancer in her maternal grandmother; Depression in her sister.    Review of Systems: Review of Systems  Constitutional: Negative.   HENT: Negative.   Respiratory: Negative.   Cardiovascular: Negative.   Gastrointestinal: Negative.   Musculoskeletal: Negative.   Neurological: Negative.   Psychiatric/Behavioral: Negative.   All other systems reviewed and are negative.    PHYSICAL EXAM: VS:  BP 120/80 (BP Location: Right Arm, Patient Position: Sitting, Cuff Size: Normal)   Pulse 68   Ht 5\' 6"  (1.676 m)   Wt 125 lb 8 oz (56.9 kg)   SpO2 98%   BMI 20.26 kg/m  , BMI Body mass index is 20.26 kg/m. GEN: Well nourished, well developed, in no acute distress HEENT: normal Neck: no JVD, carotid bruits, or masses Cardiac: RRR; no murmurs, rubs, or gallops,no edema  Respiratory:  clear to auscultation bilaterally, normal  work of breathing GI: soft, nontender, nondistended, + BS MS: no deformity or atrophy Skin: warm and dry, no rash Neuro:  Strength and sensation are intact Psych: euthymic mood, full affect   Recent Labs: 06/14/2020: ALT 10 02/02/2021: BUN 19; Creatinine, Ser 0.84; Hemoglobin 13.1; Platelets 199.0; Potassium 4.6; Sodium 142; TSH 1.23    Lipid Panel No results found for: CHOL, HDL, LDLCALC, TRIG    Wt Readings from Last 3 Encounters:  02/27/21 125 lb 8 oz (56.9 kg)  02/02/21 126 lb 8 oz (57.4 kg)   12/17/20 125 lb (56.7 kg)      ASSESSMENT AND PLAN:  Problem List Items Addressed This Visit      Cardiology Problems   Hypotension - Primary   Relevant Medications   midodrine (PROAMATINE) 10 MG tablet   Other Relevant Orders   EKG 12-Lead   Near syncope   Relevant Medications   midodrine (PROAMATINE) 10 MG tablet   Other Relevant Orders   EKG 12-Lead     Other   Palpitations   Relevant Orders   LONG TERM MONITOR (3-14 DAYS)     Palpitations Unable to exclude ectopy, versus atrial fib/flutter versus other arrhythmia such as atrial tachycardia We will place a ZIO monitor today Little room to add beta-blockers given low blood pressure  Orthostasis/near syncope Discussed strategies such as more fluid loading in the morning especially before her morning walk, some salt loading, compression hose, abdominal binder Continue fludrocortisone 0.1 mg daily We did discuss increasing the dose versus other medication such as midodrine She prefers to try midodrine 2.5 up to 10 mg up to 3 times daily as needed for blood pressure support Recommend she check orthostatics at home before starting the day, better hydration in the morning Avoid weight loss     Total encounter time more than 60 minutes  Greater than 50% was spent in counseling and coordination of care with the patient    Signed, Esmond Plants, M.D., Ph.D. [place], [place]

## 2021-03-13 DIAGNOSIS — R002 Palpitations: Secondary | ICD-10-CM

## 2021-03-17 ENCOUNTER — Emergency Department
Admission: EM | Admit: 2021-03-17 | Discharge: 2021-03-17 | Disposition: A | Discharge status: Home / Self Care | Payer: PPO | Attending: Emergency Medicine | Admitting: Emergency Medicine

## 2021-03-17 ENCOUNTER — Other Ambulatory Visit: Payer: Self-pay

## 2021-03-17 DIAGNOSIS — I959 Hypotension, unspecified: Secondary | ICD-10-CM | POA: Diagnosis not present

## 2021-03-17 DIAGNOSIS — R55 Syncope and collapse: Secondary | ICD-10-CM | POA: Diagnosis not present

## 2021-03-17 LAB — BASIC METABOLIC PANEL
Anion gap: 4 — ABNORMAL LOW (ref 5–15)
BUN: 21 mg/dL (ref 8–23)
CO2: 27 mmol/L (ref 22–32)
Calcium: 9 mg/dL (ref 8.9–10.3)
Chloride: 108 mmol/L (ref 98–111)
Creatinine, Ser: 0.75 mg/dL (ref 0.44–1.00)
GFR, Estimated: >60 mL/min (ref >60)
Glucose, Bld: 105 mg/dL — ABNORMAL HIGH (ref 70–99)
Potassium: 4.4 mmol/L (ref 3.5–5.1)
Sodium: 139 mmol/L (ref 135–145)

## 2021-03-17 LAB — CBC
HCT: 37.6 % (ref 36.0–46.0)
Hemoglobin: 12.9 g/dL (ref 12.0–15.0)
MCH: 31.3 pg (ref 26.0–34.0)
MCHC: 34.3 g/dL (ref 30.0–36.0)
MCV: 91.3 fL (ref 80.0–100.0)
Platelets: 167 10*3/uL (ref 150–400)
RBC: 4.12 MIL/uL (ref 3.87–5.11)
RDW: 12.8 % (ref 11.5–15.5)
WBC: 5.1 10*3/uL (ref 4.0–10.5)
nRBC: 0 % (ref 0.0–0.2)

## 2021-03-17 LAB — TROPONIN I (HIGH SENSITIVITY): Troponin I (High Sensitivity): <2 ng/L (ref <18)

## 2021-03-17 MED ORDER — MIDODRINE HCL 10 MG PO TABS: 10.0000 mg | ORAL_TABLET | Freq: Three times a day (TID) | ORAL | 2 refills | Status: DC | PRN

## 2021-03-17 NOTE — ED Notes (Signed)
Pts troponin has not been processed by lab at this time. This RN spoke with lab and they stated they will receive specimen in now.

## 2021-03-17 NOTE — ED Triage Notes (Signed)
Pt to ED [facility] for syncopal episode. Hx of same d/t hypotension. States she was wearing a heart monitor last week d/t these episodes. Denies pain. Alert and oriented. States did not hit head, felt weak and sat down.

## 2021-03-17 NOTE — ED Provider Notes (Signed)
[facility] Emergency Department Provider Note   ____________________________________________   None    (approximate)  I have reviewed the triage vital signs and the nursing notes.   HISTORY  Chief Complaint Loss of Consciousness    HPI Christie Lin is a 70 y.o. female with the below stated past medical history who presents for an episode of syncope earlier today.  Patient states that these episode was similar to multiple episodes she has been having since 2018.  Patient states Dr. Rockey Situ was recently prescribed midodrine but she was to use "as needed".  Patient has stated that she does not need to use this medication as she has not felt lightheaded until today.  Patient denies taking her blood pressure every day.  Patient denies any subsequent syncopal episodes since the 1 episode today. 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   Skin lesion 12/17/2020   Puncture wound of great toe 03/10/2020   Urinary frequency 08/29/2019   Low back pain 06/20/2017   Productive cough 09/03/2016   Pulmonary nodules 11/21/2015   Colon cancer screening 11/21/2015   Abdominal pain 08/29/2015   Need for hepatitis C screening test 08/29/2015   Mid back pain 08/13/2013   Right knee pain 08/13/2013   Baker's cyst of knee 08/13/2013   Near syncope 10/12/2012   Syncope 09/25/2012   Hypotension 09/25/2012   Thoracic sprain 09/08/2012   VERTIGO  01/17/2009    Past Surgical History:  Procedure Laterality Date   ABLATION ON ENDOMETRIOSIS     COLONOSCOPY     ruptured ovarian cyst     WISDOM TOOTH EXTRACTION      Prior to Admission medications   Medication Sig Start Date End Date Taking? Authorizing Provider  citalopram (CELEXA) 20 MG tablet Take 1 tablet (20 mg total) by mouth every other day. 02/02/21   Tower, Wynelle Fanny, MD  fludrocortisone (FLORINEF) 0.1 MG tablet Take 1 tablet (0.1 mg total) by mouth daily. 02/02/21   Tower, Wynelle Fanny, MD  midodrine (PROAMATINE) 10 MG tablet Take 1 tablet (10 mg total) by mouth 3 (three) times daily as needed. 02/27/21   Minna Merritts, MD    Allergies Penicillins  Family History  Problem Relation Age of Onset   Cancer Maternal Grandmother        ovarian   Depression Sister    Colon cancer Neg Hx    Esophageal cancer Neg Hx    Pancreatic cancer Neg Hx    Rectal cancer Neg Hx    Stomach cancer Neg Hx     Social History Social History   Tobacco Use   Smoking status: Never   Smokeless tobacco: Never  Vaping Use   Vaping Use: Never used  Substance Use Topics   Alcohol use: Yes    Alcohol/week: 0.0 standard drinks    Comment: rare   Drug use: No    Review of Systems Constitutional: No fever/chills  Eyes: No visual changes. ENT: No sore throat. Cardiovascular: Denies chest pain. Respiratory: Denies shortness of breath. Gastrointestinal: No abdominal pain.  No nausea, no vomiting.  No diarrhea. Genitourinary: Negative for dysuria. Musculoskeletal: Negative for acute arthralgias Skin: Negative for rash. Neurological: Negative for headaches, weakness/numbness/paresthesias in any extremity Psychiatric: Negative for suicidal ideation/homicidal ideation   ____________________________________________   PHYSICAL EXAM:  VITAL SIGNS: ED Triage Vitals [03/17/21 1427]  Enc Vitals Group     BP 124/79     Pulse Rate 68     Resp 18     Temp 97.8 F (36.6 C)     Temp Source Oral      SpO2 99 %     Weight 125 lb (56.7 kg)     Height 5\' 6"  (1.676 m)     Head Circumference      Peak Flow      Pain Score 0     Pain Loc      Pain Edu?      Excl. in [place]?    Constitutional: Alert and oriented. Well appearing and in no acute distress. Eyes: Conjunctivae are normal. PERRL. Head: Atraumatic. Nose: No congestion/rhinnorhea. Mouth/Throat: Mucous membranes are moist. Neck: No stridor Cardiovascular: Grossly normal heart sounds.  Good peripheral circulation. Respiratory: Normal respiratory effort.  No retractions. Gastrointestinal: Soft and nontender. No distention. Musculoskeletal: No obvious deformities Neurologic:  Normal speech and language. No gross focal neurologic deficits are appreciated. Skin:  Skin is warm and dry. No rash noted. Psychiatric: Mood and affect are normal. Speech and behavior are normal.  ____________________________________________   LABS (all labs ordered are listed, but only abnormal results are displayed)  Labs Reviewed  BASIC METABOLIC PANEL - Abnormal; Notable for the following components:      Result Value   Glucose, Bld 105 (*)    Anion gap 4 (*)    All other components within normal limits  CBC  URINALYSIS, COMPLETE (UACMP) WITH MICROSCOPIC  TROPONIN I (HIGH SENSITIVITY)  TROPONIN I (HIGH SENSITIVITY)   ____________________________________________  EKG  ED ECG REPORT I, Naaman Plummer, the attending physician, personally viewed and interpreted this ECG.  Date: 03/17/2021 EKG Time: 1433 Rate: 64 Rhythm: normal sinus rhythm QRS Axis: normal Intervals: normal ST/T Wave abnormalities: normal Narrative Interpretation: no evidence of acute ischemia   PROCEDURES  Procedure(s) performed (including Critical Care):  .1-3 Lead EKG Interpretation  Date/Time: 03/17/2021 4:56 PM Performed by: Naaman Plummer, MD Authorized by: Naaman Plummer, MD     Interpretation: normal     ECG rate:  80   ECG rate assessment: normal      Rhythm: sinus rhythm     Ectopy: none     Conduction: normal     ____________________________________________   INITIAL IMPRESSION / ASSESSMENT AND PLAN / ED COURSE  As part of my medical decision making, I reviewed the following data within the [place] notes reviewed and incorporated, Labs reviewed, EKG interpreted, Old chart reviewed, Radiograph reviewed and Notes from prior ED visits reviewed and incorporated      Patient presents with complaints of syncope/presyncope ED Workup:  CBC, BMP, Troponin, BNP, ECG, CXR Differential diagnosis includes HF, ICH, seizure, stroke, HOCM, ACS, aortic dissection, malignant arrhythmia, or GI bleed. Findings: No evidence of acute laboratory abnormalities.  Troponin negative x1 EKG: No e/o STEMI. No evidence of Brugadas sign, delta wave, epsilon wave, significantly prolonged QTc, or malignant arrhythmia. -Given history, physical exam, laboratory/EKG evaluation, patient is  likely suffering from her baseline intermittent hypotension for which she is already seen by cardiology.  Patient has not had any recurrent syncopal episodes.  Patient is also not been taking her midodrine as she was told she needs to use it as needed.  I recommended for patient to start taking midodrine with 3 times daily as well as monitoring her blood pressure daily. Disposition: Discharge. Patient is at baseline at this time. Return precautions expressed and understood in person. Advised follow up with primary care provider or clinic physician in next 24 hours.      ____________________________________________   FINAL CLINICAL IMPRESSION(S) / ED DIAGNOSES  Final diagnoses:  Syncope, unspecified syncope type  Hypotension, unspecified hypotension type     ED Discharge Orders     None        Note:  This document was prepared using Dragon voice recognition software and may include unintentional dictation errors.    Naaman Plummer, MD 03/17/21 [phone]

## 2021-03-17 NOTE — ED Notes (Signed)
Called lab a 2nd time to request that troponin be done on pt.

## 2021-03-18 ENCOUNTER — Telehealth: Payer: Self-pay | Admitting: Cardiovascular Disease

## 2021-03-18 DIAGNOSIS — R002 Palpitations: Secondary | ICD-10-CM | POA: Diagnosis not present

## 2021-03-18 NOTE — Telephone Encounter (Signed)
Established with gollan

## 2021-03-18 NOTE — Telephone Encounter (Signed)
Attempted to schedule [facility] ED fu visit for LOC .  LMOV to call office.

## 2021-03-19 NOTE — Telephone Encounter (Addendum)
Secure chat sent to Dr. Rockey Situ. Adv him of the patients message below.   Spoke with the patients and adv her that Dr. Rockey Situ is working in the hospital today. We have sent a msg to him to ask him to review her zio results. Adv her that we will call her back once Dr. Rockey Situ has the opportunity to review.  Patient verbalized understanding and voiced appreciation for the update.

## 2021-03-19 NOTE — Telephone Encounter (Signed)
Patient returning call. States wearing heart monitor for 2wks wants to discuss results. Leaving country Monday asking if need to come in before then.

## 2021-03-20 NOTE — Telephone Encounter (Signed)
Was able to reach out to pt via phone and make contact to review their recent ZIO monitor results. Dr. Rockey Situ advised based on the current results   Event monitor  Rare short runs of tachycardia typically lasting 6 beats, can go as long as 15 seconds  Most of them were not triggered in the diary  No urgent intervention needed prior to trip out of the country  No further treatment needed if symptoms are not bothersome  Again would be hard to add beta-blocker given low blood pressure   Would recommend she sign up for MyChart   Christie Lin is very thankful for the call this am to review heart monitor results, reports results are reassuring and feels ok to travel now. Pt thought she already had her MyChart account set up, advised showing "not active" on my side, report set if up through [facility]. Explained to pt that each organization has their own MyChart, need to set up with each individualized healthcare system and then she can link them. Pt verbalized understanding, is thankful for the results call, all questions and concerns were address. Will call back for anything further, f/u as schedule.

## 2021-08-04 ENCOUNTER — Encounter: Payer: Self-pay | Admitting: Family Medicine

## 2021-08-04 ENCOUNTER — Other Ambulatory Visit: Payer: Self-pay

## 2021-08-04 ENCOUNTER — Ambulatory Visit (INDEPENDENT_AMBULATORY_CARE_PROVIDER_SITE_OTHER): Payer: PPO | Admitting: Family Medicine

## 2021-08-04 VITALS — BP 110/82 | HR 72 | Temp 97.2°F | Ht 66.0 in | Wt 125.0 lb

## 2021-08-04 DIAGNOSIS — M545 Low back pain, unspecified: Secondary | ICD-10-CM

## 2021-08-04 DIAGNOSIS — G8929 Other chronic pain: Secondary | ICD-10-CM

## 2021-08-04 DIAGNOSIS — M25561 Pain in right knee: Secondary | ICD-10-CM | POA: Diagnosis not present

## 2021-08-04 DIAGNOSIS — M25562 Pain in left knee: Secondary | ICD-10-CM | POA: Diagnosis not present

## 2021-08-04 MED ORDER — MELOXICAM 15 MG PO TABS: 7.5000 mg | ORAL_TABLET | Freq: Every day | ORAL | 0 refills | Status: DC | PRN

## 2021-08-04 MED ORDER — METHOCARBAMOL 500 MG PO TABS: 500.0000 mg | ORAL_TABLET | Freq: Every evening | ORAL | 0 refills | Status: DC | PRN

## 2021-08-04 NOTE — Patient Instructions (Addendum)
Use gentle heat or ice if helpful   Walking is good  Put a pillow under knees if you sleep on back or between bent knees if on your side   Try meloxicam with food as needed instead of ibuprofen Try the methocarbamol (muscle relaxer) at night  I placed a referral to orthopedics You will get a call about that

## 2021-08-04 NOTE — Progress Notes (Signed)
Subjective:    Patient ID: Christie Lin, female    DOB: Feb 12, 1951, 70 y.o.   MRN: [id number]  This visit occurred during the SARS-CoV-2 public health emergency.  Safety protocols were in place, including screening questions prior to the visit, additional usage of staff PPE, and extensive cleaning of exam room while observing appropriate contact time as indicated for disinfecting solutions.   HPI Pt presents for low back pain radiating to legs  Wt Readings from Last 3 Encounters:  08/04/21 125 lb (56.7 kg)  03/17/21 125 lb (56.7 kg)  02/27/21 125 lb 8 oz (56.9 kg)   20.18 kg/m   Has had TS and LS pain for several years Now since august  Now worse some days/difficult to get up  All the way across low back -dull Some pain of different times almost every day  Also some incontinence- when she goes to stand  No bowel incontinence Uses arms to get out of chair  Legs hurt badly (makes it hard to sleep) - throbbing in nature  Knees chronically hurt   No numbness No weakness   L leg below the knee is the worst Toes cramp in both feet    Worse to to bend forward  Worse to kneel  Worse to get up after sitting   Sometimes flexing her R hip will help  Also stretching out   Walking regularly  Minimum of 8 miles per week  Sometimes walking will help the low back pain but cause more mid back pain   No spine tenderness     She has done PT in the past  (it was helpful) - did not continue the stretches  Methocarbamol- not recently , ? If helped in the past but cannot remember  Ibuprofen -helps a little (800 mg daily)     Last xStudy Result  Narrative & Impression  CLINICAL DATA:  Low back pain, worse on the right with pain radiating to the right lower extremity. Multiple myeloma. No reported injury.   EXAM: LUMBAR SPINE - COMPLETE 4+ VIEW   COMPARISON:  06/20/2017 lumbar spine radiographs.   FINDINGS: This report assumes 5 non rib-bearing lumbar vertebrae.  Mild levocurvature of the lumbar spine.   Lumbar vertebral body heights are preserved, with no fracture.   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 Stable mild 3 mm retrolisthesis at L2-3 and minimal 2 mm retrolisthesis at L3-4 and L4-5. Mild bilateral lower lumbar facet arthropathy. No aggressive appearing focal osseous lesions.   IMPRESSION: 1. 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 2. Stable mild multilevel lumbar spondylolisthesis. 3. Mild levocurvature of the lumbar spine.     Electronically Signed   By: Ilona Sorrel M.D.   On: 08/29/2019 14:56  ray of LS   Has fam h/o multiple myeloma    Review of Systems  Constitutional:  Negative for activity change, appetite change, fatigue, fever and unexpected weight change.  HENT:  Negative for congestion, ear pain, rhinorrhea, sinus pressure and sore throat.   Eyes:  Negative for pain, redness and visual disturbance.  Respiratory:  Negative for cough, shortness of breath and wheezing.   Cardiovascular:  Negative for chest pain and palpitations.  Gastrointestinal:  Negative for abdominal pain, blood in stool, constipation and diarrhea.  Endocrine: Negative for polydipsia and polyuria.  Genitourinary:  Negative for dysuria, frequency, hematuria, pelvic pain and urgency.  Some urinary incontinence with straining/standing up  Musculoskeletal:  Positive for arthralgias and back pain. Negative for myalgias.       Low back pain  Occ mid back pain   Knee pain bilat   Skin:  Negative for color change, pallor and rash.  Allergic/Immunologic: Negative for environmental allergies.  Neurological:  Negative for dizziness, syncope and headaches.  Hematological:  Negative for adenopathy. Does not bruise/bleed easily.  Psychiatric/Behavioral:  Negative for decreased concentration  and dysphoric mood. The patient is not nervous/anxious.       Objective:   Physical Exam Constitutional:      General: She is not in acute distress.    Appearance: She is normal weight. She is not ill-appearing or diaphoretic.  Cardiovascular:     Rate and Rhythm: Normal rate and regular rhythm.  Pulmonary:     Effort: Pulmonary effort is normal.  Musculoskeletal:     Cervical back: No rigidity.     Thoracic back: No spasms. Scoliosis present.     Lumbar back: No swelling, tenderness or bony tenderness. Decreased range of motion. Negative right straight leg raise test and negative left straight leg raise test. Scoliosis present.     Comments: Mild thoracolumbar scoliosis  No spinous process tenderness or SI tenderness  Flex 30 dg with pain  Ext-full  Lateral bend- can go further on L  Nl gait, no foot drop   Nl rom bilat hips w/o pain  Some crepitus-knees   No nduro findings   Neurological:     Mental Status: She is alert.     Sensory: No sensory deficit.     Motor: No weakness.     Coordination: Coordination normal.     Deep Tendon Reflexes: Reflexes normal.  Psychiatric:        Mood and Affect: Mood normal.          Assessment & Plan:   Problem List Items Addressed This Visit       Other   Low back pain potentially associated with radiculopathy - Primary    Some symptoms of L radiculopathy Acute on chronic (per pt somewhat worse and different) Some urinary incontinence (stress type)  Also some scoliosis  Known disc dz in the past  She failed conservative management  Old xr ays reviewed as well as notes  Decided to refer to orthopedics for imaging and further treatment  Handout given with rehab/sciatica and reviewed  meloxicam px for use daily with meal prn  Also methocarbamol for prn use with caution of sedation  inst to update if symptoms worsen while waiting for orthop appt ER precautions discussed        Relevant Medications   meloxicam (MOBIC) 15  MG tablet   methocarbamol (ROBAXIN) 500 MG tablet   Knee pain, bilateral   Relevant Orders   Ambulatory referral to Orthopedic Surgery

## 2021-08-04 NOTE — Assessment & Plan Note (Addendum)
Some symptoms of L radiculopathy Acute on chronic (per pt somewhat worse and different) Some urinary incontinence (stress type)  Also some scoliosis  Known disc dz in the past  She failed conservative management  Old xr ays reviewed as well as notes  Decided to refer to orthopedics for imaging and further treatment  Handout given with rehab/sciatica and reviewed  meloxicam px for use daily with meal prn  Also methocarbamol for prn use with caution of sedation  inst to update if symptoms worsen while waiting for orthop appt ER precautions discussed

## 2021-08-11 ENCOUNTER — Telehealth: Payer: Self-pay | Admitting: Family Medicine

## 2021-08-11 NOTE — Telephone Encounter (Signed)
Pt called in stating that she has not heard anything from the referral people to schedule an appt. Pt said it has been a week now. She is in pain and she would like to be called regarding this.

## 2021-08-11 NOTE — Telephone Encounter (Signed)
Will route to referral pool to check status of ortho referral

## 2021-08-28 DIAGNOSIS — M545 Low back pain, unspecified: Secondary | ICD-10-CM | POA: Diagnosis not present

## 2021-08-28 DIAGNOSIS — M170 Bilateral primary osteoarthritis of knee: Secondary | ICD-10-CM | POA: Diagnosis not present

## 2021-10-09 DIAGNOSIS — M545 Low back pain, unspecified: Secondary | ICD-10-CM | POA: Diagnosis not present

## 2021-10-20 DIAGNOSIS — M545 Low back pain, unspecified: Secondary | ICD-10-CM | POA: Diagnosis not present

## 2021-10-20 DIAGNOSIS — M2560 Stiffness of unspecified joint, not elsewhere classified: Secondary | ICD-10-CM | POA: Diagnosis not present

## 2021-10-28 DIAGNOSIS — M545 Low back pain, unspecified: Secondary | ICD-10-CM | POA: Diagnosis not present

## 2021-10-28 DIAGNOSIS — M2560 Stiffness of unspecified joint, not elsewhere classified: Secondary | ICD-10-CM | POA: Diagnosis not present

## 2021-11-03 DIAGNOSIS — M545 Low back pain, unspecified: Secondary | ICD-10-CM | POA: Diagnosis not present

## 2021-11-03 DIAGNOSIS — M2560 Stiffness of unspecified joint, not elsewhere classified: Secondary | ICD-10-CM | POA: Diagnosis not present

## 2021-11-12 ENCOUNTER — Other Ambulatory Visit: Payer: Self-pay | Admitting: Family Medicine

## 2021-11-13 DIAGNOSIS — M2560 Stiffness of unspecified joint, not elsewhere classified: Secondary | ICD-10-CM | POA: Diagnosis not present

## 2021-11-13 DIAGNOSIS — M545 Low back pain, unspecified: Secondary | ICD-10-CM | POA: Diagnosis not present

## 2021-11-16 DIAGNOSIS — M2560 Stiffness of unspecified joint, not elsewhere classified: Secondary | ICD-10-CM | POA: Diagnosis not present

## 2021-11-16 DIAGNOSIS — M545 Low back pain, unspecified: Secondary | ICD-10-CM | POA: Diagnosis not present

## 2021-11-23 DIAGNOSIS — M2560 Stiffness of unspecified joint, not elsewhere classified: Secondary | ICD-10-CM | POA: Diagnosis not present

## 2021-11-23 DIAGNOSIS — M545 Low back pain, unspecified: Secondary | ICD-10-CM | POA: Diagnosis not present

## 2021-11-27 DIAGNOSIS — M2560 Stiffness of unspecified joint, not elsewhere classified: Secondary | ICD-10-CM | POA: Diagnosis not present

## 2021-11-27 DIAGNOSIS — M545 Low back pain, unspecified: Secondary | ICD-10-CM | POA: Diagnosis not present

## 2021-11-27 DIAGNOSIS — M170 Bilateral primary osteoarthritis of knee: Secondary | ICD-10-CM | POA: Diagnosis not present

## 2021-12-07 DIAGNOSIS — M545 Low back pain, unspecified: Secondary | ICD-10-CM | POA: Diagnosis not present

## 2021-12-07 DIAGNOSIS — M2560 Stiffness of unspecified joint, not elsewhere classified: Secondary | ICD-10-CM | POA: Diagnosis not present

## 2021-12-09 DIAGNOSIS — M25562 Pain in left knee: Secondary | ICD-10-CM | POA: Diagnosis not present

## 2021-12-15 DIAGNOSIS — M25562 Pain in left knee: Secondary | ICD-10-CM | POA: Diagnosis not present

## 2021-12-18 DIAGNOSIS — M1712 Unilateral primary osteoarthritis, left knee: Secondary | ICD-10-CM | POA: Diagnosis not present

## 2021-12-23 NOTE — Telephone Encounter (Signed)
ERROR

## 2021-12-30 ENCOUNTER — Other Ambulatory Visit: Payer: Self-pay | Admitting: Orthopedic Surgery

## 2021-12-30 ENCOUNTER — Encounter: Payer: Self-pay | Admitting: Orthopedic Surgery

## 2021-12-30 DIAGNOSIS — Z01818 Encounter for other preprocedural examination: Secondary | ICD-10-CM

## 2021-12-30 NOTE — H&P (Signed)
Christie Lin ?MRN:  [id number] ?DOB/SEX:  June 24, 1951/female ? ?CHIEF COMPLAINT:  Painful left Knee ? ?HISTORY: ?Patient is a 71 y.o. female presented with a history of pain in the left knee. Onset of symptoms was gradual starting several years ago with gradually worsening course since that time. Prior procedures on the knee include none. Patient has been treated conservatively with over-the-counter NSAIDs and activity modification. Patient currently rates pain in the knee at 10 out of 10 with activity. There is pain at night. ? ?PAST MEDICAL HISTORY: ?Patient Active Problem List  ? Diagnosis Date Noted  ? Knee pain, bilateral 08/04/2021  ? Palpitations 02/02/2021  ? 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 Skin lesion 12/17/2020  ? Puncture wound of great toe 03/10/2020  ? Urinary frequency 08/29/2019  ? Low back pain potentially associated with radiculopathy 06/20/2017  ? Productive cough 09/03/2016  ? Pulmonary nodules 11/21/2015  ? Colon cancer screening 11/21/2015  ? Abdominal pain 08/29/2015  ? Need for hepatitis C screening test 08/29/2015  ? Mid back pain 08/13/2013  ? Right knee pain 08/13/2013  ? Baker's cyst of knee 08/13/2013  ? Near syncope 10/12/2012  ? Syncope 09/25/2012  ? Hypotension 09/25/2012  ? Thoracic sprain 09/08/2012  ? VERTIGO 01/17/2009  ? ?Past Medical History:  ?Diagnosis Date  ? Anemia   ? Blood transfusion without reported diagnosis   ? Depression   ? grief reaction  ? Endometriosis   ? Hypotension   ? per pt she has fainted from low b/p in the past  ? ?Past Surgical History:  ?Procedure Laterality Date  ? ABLATION ON ENDOMETRIOSIS    ? COLONOSCOPY    ? ruptured ovarian cyst    ? WISDOM TOOTH EXTRACTION    ?  ? ?MEDICATIONS:  (Not in a hospital admission) ? ? ?ALLERGIES:   ?Allergies  ?Allergen Reactions  ? Penicillins Swelling  ?  Throat swelling, rash  ? ? ?REVIEW OF SYSTEMS:  ?Pertinent items are noted in HPI. ? ? ?FAMILY HISTORY:   ?Family History  ?Problem Relation Age of Onset  ? Cancer  Maternal Grandmother   ?     ovarian  ? Depression Sister   ? Colon cancer Neg Hx   ? Esophageal cancer Neg Hx   ? Pancreatic cancer Neg Hx   ? Rectal cancer Neg Hx   ? Stomach cancer Neg Hx   ? ? ?SOCIAL HISTORY:   ?Social History  ? ?Tobacco Use  ? Smoking status: Never  ? Smokeless tobacco: Never  ?Substance Use Topics  ? Alcohol use: Yes  ?  Alcohol/week: 0.0 standard drinks  ?  Comment: rare  ? ?  ?EXAMINATION: ? ?Vital signs in last 24 hours: ?'@VSRANGES'$ @ ? ?General appearance: alert, cooperative, and no distress ?Neck: no JVD and supple, symmetrical, trachea midline ?Lungs: clear to auscultation bilaterally ?Heart: regular rate and rhythm, S1, S2 normal, no murmur, click, rub or gallop ?Abdomen: soft, non-tender; bowel sounds normal; no masses,  no organomegaly ?Extremities: extremities normal, atraumatic, no cyanosis or edema and Homans sign is negative, no sign of DVT ?Pulses: 2+ and symmetric ?Skin: Skin color, texture, turgor normal. No rashes or lesions ?Neurologic: Alert and oriented X 3, normal strength and tone. Normal symmetric reflexes. Normal coordination and gait ? ?Musculoskeletal:  ROM 0-100, Ligaments intact,  ?Imaging Review ?Plain radiographs demonstrate severe degenerative joint disease of the left knee. The overall alignment is significant varus. The bone quality appears to be good for age and reported activity level. ? ?  Assessment/Plan: ?Primary osteoarthritis, left knee  ? ?The patient history, physical examination and imaging studies are consistent with constant degenerative joint disease of the left knee. The patient has failed conservative treatment.  The clearance notes were reviewed.  After discussion with the patient it was felt that Total Knee Replacement was indicated. The procedure,  risks, and benefits of total knee arthroplasty were presented and reviewed. The risks including but not limited to aseptic loosening, infection, blood clots, vascular injury, stiffness, patella  tracking problems complications among others were discussed. The patient acknowledged the explanation, agreed to proceed with the plan. ? ?Carlynn Spry ?12/30/2021, 2:49 PM  ?

## 2022-01-12 ENCOUNTER — Other Ambulatory Visit: Payer: Self-pay | Admitting: Orthopedic Surgery

## 2022-01-14 ENCOUNTER — Other Ambulatory Visit: Payer: Self-pay

## 2022-01-14 ENCOUNTER — Encounter
Admission: RE | Admit: 2022-01-14 | Discharge: 2022-01-14 | Disposition: A | Discharge status: Home / Self Care | Payer: PPO | Source: Ambulatory Visit | Attending: Orthopedic Surgery | Admitting: Orthopedic Surgery

## 2022-01-14 VITALS — BP 117/76 | HR 81 | Temp 97.6°F | Resp 18 | Ht 65.5 in | Wt 123.5 lb

## 2022-01-14 DIAGNOSIS — Z01818 Encounter for other preprocedural examination: Secondary | ICD-10-CM | POA: Diagnosis not present

## 2022-01-14 DIAGNOSIS — Z01810 Encounter for preprocedural cardiovascular examination: Secondary | ICD-10-CM | POA: Diagnosis not present

## 2022-01-14 HISTORY — DX: Scoliosis, unspecified: M41.9

## 2022-01-14 HISTORY — DX: Unspecified fall, initial encounter: W19.XXXA

## 2022-01-14 HISTORY — DX: Low back pain, unspecified: M54.50

## 2022-01-14 LAB — TYPE AND SCREEN
ABO/RH(D): A POS
Antibody Screen: NEGATIVE

## 2022-01-14 LAB — URINALYSIS, ROUTINE W REFLEX MICROSCOPIC
Bilirubin Urine: NEGATIVE
Glucose, UA: NEGATIVE mg/dL
Hgb urine dipstick: NEGATIVE
Ketones, ur: NEGATIVE mg/dL
Leukocytes,Ua: NEGATIVE
Nitrite: NEGATIVE
Protein, ur: NEGATIVE mg/dL
Specific Gravity, Urine: 1.009 (ref 1.005–1.030)
pH: 5 (ref 5.0–8.0)

## 2022-01-14 LAB — BASIC METABOLIC PANEL
Anion gap: 3 — ABNORMAL LOW (ref 5–15)
BUN: 14 mg/dL (ref 8–23)
CO2: 29 mmol/L (ref 22–32)
Calcium: 9.2 mg/dL (ref 8.9–10.3)
Chloride: 106 mmol/L (ref 98–111)
Creatinine, Ser: 0.8 mg/dL (ref 0.44–1.00)
GFR, Estimated: >60 mL/min (ref >60)
Glucose, Bld: 96 mg/dL (ref 70–99)
Potassium: 3.8 mmol/L (ref 3.5–5.1)
Sodium: 138 mmol/L (ref 135–145)

## 2022-01-14 LAB — CBC
HCT: 39.1 % (ref 36.0–46.0)
Hemoglobin: 13.1 g/dL (ref 12.0–15.0)
MCH: 30.6 pg (ref 26.0–34.0)
MCHC: 33.5 g/dL (ref 30.0–36.0)
MCV: 91.4 fL (ref 80.0–100.0)
Platelets: 173 10*3/uL (ref 150–400)
RBC: 4.28 MIL/uL (ref 3.87–5.11)
RDW: 13.1 % (ref 11.5–15.5)
WBC: 4.4 10*3/uL (ref 4.0–10.5)
nRBC: 0 % (ref 0.0–0.2)

## 2022-01-14 LAB — SURGICAL PCR SCREEN
MRSA, PCR: NEGATIVE
Staphylococcus aureus: NEGATIVE

## 2022-01-14 NOTE — Patient Instructions (Addendum)
Your procedure is scheduled on: Wednesday 01/20/22 ?Report to the Registration Desk on the 1st floor of the [facility]. ?To find out your arrival time, please call [phone] between 1PM - 3PM on: Tuesday 01/19/22 ? ?REMEMBER: ?Instructions that are not followed completely may result in serious medical risk, up to and including death; or upon the discretion of your surgeon and anesthesiologist your surgery may need to be rescheduled. ? ?Do not eat or drink after midnight the night before surgery.  ?No gum chewing, lozengers or hard candies. ? ?TAKE THESE MEDICATIONS THE MORNING OF SURGERY WITH A SIP OF WATER: ?citalopram (CELEXA) 20 MG tablet ?midodrine (PROAMATINE) 10 MG tablet ? ? ?One week prior to surgery: ?Stop Anti-inflammatories (NSAIDS) such as Advil, Aleve, Ibuprofen, Motrin, Naproxen, Naprosyn and Aspirin based products such as Excedrin, Goodys Powder, BC Powder. ? ?Stop taking your MELATONIN PO and ANY OVER THE COUNTER supplements until after surgery. ?You may however, continue to take Tylenol if needed for pain up until the day of surgery. ? ?No Alcohol for 24 hours before or after surgery. ? ?No Smoking including e-cigarettes for 24 hours prior to surgery.  ?No chewable tobacco products for at least 6 hours prior to surgery.  ?No nicotine patches on the day of surgery. ? ?Do not use any "recreational" drugs for at least a week prior to your surgery.  ?Please be advised that the combination of cocaine and anesthesia may have negative outcomes, up to and including death. ?If you test positive for cocaine, your surgery will be cancelled. ? ?On the morning of surgery brush your teeth with toothpaste and water, you may rinse your mouth with mouthwash if you wish. ?Do not swallow any toothpaste or mouthwash. ? ?Use CHG Soap  as directed on instruction sheet. ? ?Do not wear jewelry, make-up, hairpins, clips or nail polish. ? ?Do not wear lotions, powders, or perfumes.  ? ?Do not shave body from the neck  down 48 hours prior to surgery just in case you cut yourself which could leave a site for infection.  ?Also, freshly shaved skin may become irritated if using the CHG soap. ? ?Do not bring valuables to the hospital. [facility] is not responsible for any missing/lost belongings or valuables.  ? ?Notify your doctor if there is any change in your medical condition (cold, fever, infection). ? ?Wear comfortable clothing (specific to your surgery type) to the hospital. ? ?After surgery, you can help prevent lung complications by doing breathing exercises.  ?Take deep breaths and cough every 1-2 hours. Your doctor may order a device called an Incentive Spirometer to help you take deep breaths. ? ?If you are being admitted to the hospital overnight, leave your suitcase in the car. ?After surgery it may be brought to your room. ? ?If you are taking public transportation, you will need to have a responsible adult (18 years or older) with you. ?Please confirm with your physician that it is acceptable to use public transportation.  ? ?Please call the [facility] at [phone] if you have any questions about these instructions. ? ?Surgery Visitation Policy: ? ?Patients undergoing a surgery or procedure may have two family members or support persons with them as long as the person is not COVID-19 positive or experiencing its symptoms.  ? ?Inpatient Visitation:   ? ?Visiting hours are 7 a.m. to 8 p.m. ?Up to four visitors are allowed at one time in a patient room, including children. The visitors may rotate out  with other people during the day. One designated support person (adult) may remain overnight.  ?

## 2022-01-19 MED ORDER — POVIDONE-IODINE 10 % EX SWAB: 2.0000 "application " | Freq: Once | CUTANEOUS | Status: DC

## 2022-01-19 MED ORDER — LACTATED RINGERS IV SOLN
INTRAVENOUS | Status: DC
Start: 2022-01-19 — End: 2022-01-20

## 2022-01-19 MED ORDER — ORAL CARE MOUTH RINSE: 15.0000 mL | Freq: Once | OROMUCOSAL | Status: AC

## 2022-01-19 MED ORDER — CHLORHEXIDINE GLUCONATE 0.12 % MT SOLN
15.0000 mL | Freq: Once | OROMUCOSAL | Status: AC
Start: 2022-01-19 — End: 2022-01-20

## 2022-01-19 MED ORDER — LACTATED RINGERS IV SOLN: INTRAVENOUS | Status: DC

## 2022-01-19 MED ORDER — CLINDAMYCIN PHOSPHATE 900 MG/50ML IV SOLN
900.0000 mg | INTRAVENOUS | Status: AC
  Administered 2022-01-20: 900 mg via INTRAVENOUS

## 2022-01-19 MED ORDER — TRANEXAMIC ACID-NACL 1000-0.7 MG/100ML-% IV SOLN: 1000.0000 mg | INTRAVENOUS | Status: DC

## 2022-01-20 ENCOUNTER — Ambulatory Visit: Payer: PPO

## 2022-01-20 ENCOUNTER — Ambulatory Visit: Payer: PPO | Admitting: Anesthesiology

## 2022-01-20 ENCOUNTER — Encounter: Payer: Self-pay | Admitting: Orthopedic Surgery

## 2022-01-20 ENCOUNTER — Other Ambulatory Visit: Payer: Self-pay

## 2022-01-20 ENCOUNTER — Observation Stay: Payer: PPO

## 2022-01-20 ENCOUNTER — Encounter
Admission: RE | Disposition: A | Discharge status: Home Health | Payer: Self-pay | Source: Ambulatory Visit | Attending: Orthopedic Surgery

## 2022-01-20 ENCOUNTER — Observation Stay
Admission: RE | Admit: 2022-01-20 | Discharge: 2022-01-21 | Disposition: A | Discharge status: Home Health | Payer: PPO | Source: Ambulatory Visit | Attending: Orthopedic Surgery | Admitting: Orthopedic Surgery

## 2022-01-20 DIAGNOSIS — Z96652 Presence of left artificial knee joint: Secondary | ICD-10-CM | POA: Diagnosis not present

## 2022-01-20 DIAGNOSIS — M1712 Unilateral primary osteoarthritis, left knee: Principal | ICD-10-CM | POA: Insufficient documentation

## 2022-01-20 DIAGNOSIS — G8918 Other acute postprocedural pain: Secondary | ICD-10-CM | POA: Diagnosis not present

## 2022-01-20 DIAGNOSIS — M25562 Pain in left knee: Secondary | ICD-10-CM | POA: Diagnosis not present

## 2022-01-20 HISTORY — PX: TOTAL KNEE ARTHROPLASTY: SHX125

## 2022-01-20 SURGERY — ARTHROPLASTY, KNEE, TOTAL
Anesthesia: Regional | Site: Knee | Laterality: Left

## 2022-01-20 MED ORDER — ASPIRIN 81 MG PO CHEW
81.0000 mg | CHEWABLE_TABLET | Freq: Two times a day (BID) | ORAL | Status: DC
  Administered 2022-01-20: 81 mg via ORAL
  Filled 2022-01-20: qty 1

## 2022-01-20 MED ORDER — 0.9 % SODIUM CHLORIDE (POUR BTL) OPTIME
TOPICAL | Status: DC | PRN
  Administered 2022-01-20: 1000 mL

## 2022-01-20 MED ORDER — METHOCARBAMOL 1000 MG/10ML IJ SOLN
500.0000 mg | Freq: Four times a day (QID) | INTRAVENOUS | Status: DC | PRN
  Administered 2022-01-20: 500 mg via INTRAVENOUS
  Filled 2022-01-20: qty 500
  Filled 2022-01-20: qty 5

## 2022-01-20 MED ORDER — SODIUM CHLORIDE 0.9 % IR SOLN
Status: DC | PRN
Start: 2022-01-20 — End: 2022-01-20
  Administered 2022-01-20: 3000 mL

## 2022-01-20 MED ORDER — ACETAMINOPHEN 10 MG/ML IV SOLN
INTRAVENOUS | Status: AC
  Administered 2022-01-20: 1000 mg via INTRAVENOUS
  Filled 2022-01-20: qty 100

## 2022-01-20 MED ORDER — OXYCODONE HCL 5 MG PO TABS
ORAL_TABLET | ORAL | Status: AC
  Filled 2022-01-20: qty 1

## 2022-01-20 MED ORDER — LACTATED RINGERS IV SOLN: INTRAVENOUS | Status: DC

## 2022-01-20 MED ORDER — GLYCOPYRROLATE 0.2 MG/ML IJ SOLN
INTRAMUSCULAR | Status: DC | PRN
Start: 2022-01-20 — End: 2022-01-20
  Administered 2022-01-20: .2 mg via INTRAVENOUS

## 2022-01-20 MED ORDER — HYDROMORPHONE HCL 1 MG/ML IJ SOLN
0.5000 mg | INTRAMUSCULAR | Status: DC | PRN
  Administered 2022-01-20: 0.5 mg via INTRAVENOUS

## 2022-01-20 MED ORDER — KETOROLAC TROMETHAMINE 15 MG/ML IJ SOLN
7.5000 mg | Freq: Four times a day (QID) | INTRAMUSCULAR | Status: AC
  Administered 2022-01-20 (×2): 7.5 mg via INTRAVENOUS

## 2022-01-20 MED ORDER — BUPIVACAINE HCL (PF) 0.5 % IJ SOLN
INTRAMUSCULAR | Status: DC | PRN
  Administered 2022-01-20: 2.4 mL via INTRATHECAL

## 2022-01-20 MED ORDER — OXYCODONE HCL 5 MG/5ML PO SOLN: 5.0000 mg | Freq: Once | ORAL | Status: DC | PRN

## 2022-01-20 MED ORDER — PROPOFOL 500 MG/50ML IV EMUL
INTRAVENOUS | Status: DC | PRN
  Administered 2022-01-20: 50 ug/kg/min via INTRAVENOUS

## 2022-01-20 MED ORDER — VANCOMYCIN HCL IN DEXTROSE 1-5 GM/200ML-% IV SOLN: 1000.0000 mg | INTRAVENOUS | Status: DC

## 2022-01-20 MED ORDER — STERILE WATER FOR IRRIGATION IR SOLN
Status: DC | PRN
  Administered 2022-01-20: 1000 mL

## 2022-01-20 MED ORDER — LACTATED RINGERS IV BOLUS
500.0000 mL | Freq: Once | INTRAVENOUS | Status: AC
  Administered 2022-01-20: 500 mL via INTRAVENOUS

## 2022-01-20 MED ORDER — BUPIVACAINE HCL (PF) 0.5 % IJ SOLN
INTRAMUSCULAR | Status: AC
  Filled 2022-01-20: qty 20

## 2022-01-20 MED ORDER — KETOROLAC TROMETHAMINE 15 MG/ML IJ SOLN
INTRAMUSCULAR | Status: AC
  Administered 2022-01-20: 7.5 mg via INTRAVENOUS
  Filled 2022-01-20: qty 1

## 2022-01-20 MED ORDER — MIDAZOLAM HCL 2 MG/2ML IJ SOLN
INTRAMUSCULAR | Status: AC
  Administered 2022-01-20: 2 mg via INTRAVENOUS
  Filled 2022-01-20: qty 2

## 2022-01-20 MED ORDER — METOCLOPRAMIDE HCL 5 MG/ML IJ SOLN
5.0000 mg | Freq: Three times a day (TID) | INTRAMUSCULAR | Status: DC | PRN
  Administered 2022-01-21: 10 mg via INTRAVENOUS

## 2022-01-20 MED ORDER — KETOROLAC TROMETHAMINE 15 MG/ML IJ SOLN
INTRAMUSCULAR | Status: AC
  Filled 2022-01-20: qty 1

## 2022-01-20 MED ORDER — GLYCOPYRROLATE 0.2 MG/ML IJ SOLN
INTRAMUSCULAR | Status: AC
  Filled 2022-01-20: qty 1

## 2022-01-20 MED ORDER — PHENYLEPHRINE HCL (PRESSORS) 10 MG/ML IV SOLN
INTRAVENOUS | Status: DC | PRN
  Administered 2022-01-20: 80 ug via INTRAVENOUS

## 2022-01-20 MED ORDER — FENTANYL CITRATE (PF) 100 MCG/2ML IJ SOLN
INTRAMUSCULAR | Status: AC
  Filled 2022-01-20: qty 2

## 2022-01-20 MED ORDER — PHENOL 1.4 % MT LIQD: 1.0000 | OROMUCOSAL | Status: DC | PRN

## 2022-01-20 MED ORDER — OXYCODONE HCL 5 MG PO TABS
ORAL_TABLET | ORAL | Status: AC
  Administered 2022-01-20: 5 mg via ORAL
  Filled 2022-01-20: qty 1

## 2022-01-20 MED ORDER — OXYCODONE HCL 5 MG PO TABS
5.0000 mg | ORAL_TABLET | ORAL | Status: DC | PRN
  Administered 2022-01-20 (×2): 5 mg via ORAL

## 2022-01-20 MED ORDER — METHOCARBAMOL 500 MG PO TABS
500.0000 mg | ORAL_TABLET | Freq: Four times a day (QID) | ORAL | Status: DC | PRN
  Administered 2022-01-21: 500 mg via ORAL

## 2022-01-20 MED ORDER — ALUM & MAG HYDROXIDE-SIMETH 200-200-20 MG/5ML PO SUSP: 30.0000 mL | ORAL | Status: DC | PRN

## 2022-01-20 MED ORDER — ONDANSETRON HCL 4 MG/2ML IJ SOLN: 4.0000 mg | Freq: Once | INTRAMUSCULAR | Status: DC | PRN

## 2022-01-20 MED ORDER — LIDOCAINE HCL (PF) 2 % IJ SOLN
INTRAMUSCULAR | Status: DC | PRN
  Administered 2022-01-20: 50 mg

## 2022-01-20 MED ORDER — HYDROMORPHONE HCL 1 MG/ML IJ SOLN
INTRAMUSCULAR | Status: AC
  Filled 2022-01-20: qty 0.5

## 2022-01-20 MED ORDER — BUPIVACAINE-EPINEPHRINE (PF) 0.5% -1:200000 IJ SOLN
INTRAMUSCULAR | Status: DC | PRN
  Administered 2022-01-20: 50 mL via INTRAMUSCULAR

## 2022-01-20 MED ORDER — MIDODRINE HCL 5 MG PO TABS
10.0000 mg | ORAL_TABLET | Freq: Three times a day (TID) | ORAL | Status: DC | PRN
  Administered 2022-01-20 – 2022-01-21 (×3): 10 mg via ORAL
  Filled 2022-01-20 (×4): qty 2

## 2022-01-20 MED ORDER — ACETAMINOPHEN 325 MG PO TABS
325.0000 mg | ORAL_TABLET | Freq: Four times a day (QID) | ORAL | Status: DC | PRN
  Administered 2022-01-21: 650 mg via ORAL

## 2022-01-20 MED ORDER — BUPIVACAINE-EPINEPHRINE (PF) 0.5% -1:200000 IJ SOLN
INTRAMUSCULAR | Status: AC
  Filled 2022-01-20: qty 30

## 2022-01-20 MED ORDER — FENTANYL CITRATE (PF) 100 MCG/2ML IJ SOLN: 25.0000 ug | INTRAMUSCULAR | Status: DC | PRN

## 2022-01-20 MED ORDER — CLINDAMYCIN PHOSPHATE 900 MG/50ML IV SOLN
INTRAVENOUS | Status: AC
  Filled 2022-01-20: qty 50

## 2022-01-20 MED ORDER — CITALOPRAM HYDROBROMIDE 20 MG PO TABS
20.0000 mg | ORAL_TABLET | ORAL | Status: DC
  Administered 2022-01-21: 20 mg via ORAL
  Filled 2022-01-20: qty 1

## 2022-01-20 MED ORDER — BUPIVACAINE HCL (PF) 0.5 % IJ SOLN
INTRAMUSCULAR | Status: AC
Start: 2022-01-20 — End: ?
  Filled 2022-01-20: qty 10

## 2022-01-20 MED ORDER — ONDANSETRON HCL 4 MG PO TABS: 4.0000 mg | ORAL_TABLET | Freq: Four times a day (QID) | ORAL | Status: DC | PRN

## 2022-01-20 MED ORDER — TRANEXAMIC ACID-NACL 1000-0.7 MG/100ML-% IV SOLN
INTRAVENOUS | Status: AC
  Filled 2022-01-20: qty 100

## 2022-01-20 MED ORDER — ACETAMINOPHEN 10 MG/ML IV SOLN: 1000.0000 mg | Freq: Once | INTRAVENOUS | Status: DC | PRN

## 2022-01-20 MED ORDER — LIDOCAINE HCL (PF) 2 % IJ SOLN
INTRAMUSCULAR | Status: AC
Start: 2022-01-20 — End: ?
  Filled 2022-01-20: qty 5

## 2022-01-20 MED ORDER — ONDANSETRON HCL 4 MG/2ML IJ SOLN
INTRAMUSCULAR | Status: DC | PRN
  Administered 2022-01-20: 4 mg via INTRAVENOUS

## 2022-01-20 MED ORDER — PHENYLEPHRINE 80 MCG/ML (10ML) SYRINGE FOR IV PUSH (FOR BLOOD PRESSURE SUPPORT)
PREFILLED_SYRINGE | INTRAVENOUS | Status: AC
  Filled 2022-01-20: qty 10

## 2022-01-20 MED ORDER — FENTANYL CITRATE PF 50 MCG/ML IJ SOSY: 50.0000 ug | PREFILLED_SYRINGE | Freq: Once | INTRAMUSCULAR | Status: AC

## 2022-01-20 MED ORDER — DIPHENHYDRAMINE HCL 12.5 MG/5ML PO ELIX: 12.5000 mg | ORAL_SOLUTION | ORAL | Status: DC | PRN

## 2022-01-20 MED ORDER — VANCOMYCIN HCL IN DEXTROSE 1-5 GM/200ML-% IV SOLN: 1000.0000 mg | Freq: Once | INTRAVENOUS | Status: DC

## 2022-01-20 MED ORDER — OXYCODONE HCL 5 MG PO TABS: 5.0000 mg | ORAL_TABLET | Freq: Once | ORAL | Status: DC | PRN

## 2022-01-20 MED ORDER — ONDANSETRON HCL 4 MG/2ML IJ SOLN: 4.0000 mg | Freq: Four times a day (QID) | INTRAMUSCULAR | Status: DC | PRN

## 2022-01-20 MED ORDER — POVIDONE-IODINE 10 % EX SWAB
2.0000 "application " | Freq: Once | CUTANEOUS | Status: DC
  Administered 2022-01-20: 2 via TOPICAL

## 2022-01-20 MED ORDER — MIDAZOLAM HCL 2 MG/2ML IJ SOLN
INTRAMUSCULAR | Status: AC
  Filled 2022-01-20: qty 2

## 2022-01-20 MED ORDER — SURGIRINSE WOUND IRRIGATION SYSTEM - OPTIME
TOPICAL | Status: DC | PRN
  Administered 2022-01-20: 900 mL

## 2022-01-20 MED ORDER — DOCUSATE SODIUM 100 MG PO CAPS
100.0000 mg | ORAL_CAPSULE | Freq: Two times a day (BID) | ORAL | Status: DC
  Administered 2022-01-20 (×2): 100 mg via ORAL
  Filled 2022-01-20: qty 1

## 2022-01-20 MED ORDER — MENTHOL 3 MG MT LOZG: 1.0000 | LOZENGE | OROMUCOSAL | Status: DC | PRN

## 2022-01-20 MED ORDER — DOCUSATE SODIUM 100 MG PO CAPS
ORAL_CAPSULE | ORAL | Status: AC
  Filled 2022-01-20: qty 1

## 2022-01-20 MED ORDER — OXYCODONE HCL 5 MG PO TABS: 10.0000 mg | ORAL_TABLET | ORAL | Status: DC | PRN

## 2022-01-20 MED ORDER — TRANEXAMIC ACID-NACL 1000-0.7 MG/100ML-% IV SOLN
1000.0000 mg | INTRAVENOUS | Status: AC
  Administered 2022-01-20: 1000 mg via INTRAVENOUS

## 2022-01-20 MED ORDER — FENTANYL CITRATE PF 50 MCG/ML IJ SOSY
PREFILLED_SYRINGE | INTRAMUSCULAR | Status: AC
  Administered 2022-01-20: 50 ug via INTRAVENOUS
  Filled 2022-01-20: qty 1

## 2022-01-20 MED ORDER — BUPIVACAINE LIPOSOME 1.3 % IJ SUSP
INTRAMUSCULAR | Status: AC
  Filled 2022-01-20: qty 20

## 2022-01-20 MED ORDER — CHLORHEXIDINE GLUCONATE 0.12 % MT SOLN
OROMUCOSAL | Status: AC
  Administered 2022-01-20: 15 mL via OROMUCOSAL
  Filled 2022-01-20: qty 15

## 2022-01-20 MED ORDER — BUPIVACAINE HCL (PF) 0.5 % IJ SOLN
INTRAMUSCULAR | Status: DC | PRN
  Administered 2022-01-20: 20 mL

## 2022-01-20 MED ORDER — METOCLOPRAMIDE HCL 10 MG PO TABS: 5.0000 mg | ORAL_TABLET | Freq: Three times a day (TID) | ORAL | Status: DC | PRN

## 2022-01-20 MED ORDER — DEXMEDETOMIDINE (PRECEDEX) IN NS 20 MCG/5ML (4 MCG/ML) IV SYRINGE
PREFILLED_SYRINGE | INTRAVENOUS | Status: DC | PRN
  Administered 2022-01-20: 10 ug via INTRAVENOUS

## 2022-01-20 MED ORDER — MIDAZOLAM HCL 2 MG/2ML IJ SOLN: 2.0000 mg | Freq: Once | INTRAMUSCULAR | Status: AC

## 2022-01-20 MED ORDER — BISACODYL 10 MG RE SUPP
10.0000 mg | Freq: Every day | RECTAL | Status: DC | PRN
  Filled 2022-01-20: qty 1

## 2022-01-20 MED ORDER — ONDANSETRON HCL 4 MG/2ML IJ SOLN
INTRAMUSCULAR | Status: AC
  Filled 2022-01-20: qty 2

## 2022-01-20 SURGICAL SUPPLY — 64 items
APL PRP STRL LF DISP 70% ISPRP (MISCELLANEOUS) ×2
BASEPLATE TIBIAL SZ 2 LT (Knees) ×1 IMPLANT
BLADE SAGITTAL AGGR TOOTH XLG (BLADE) ×2 IMPLANT
BLADE SAW SAG 25X90X1.19 (BLADE) ×2 IMPLANT
BOWL CEMENT MIX W/ADAPTER (MISCELLANEOUS) ×2 IMPLANT
BRUSH SCRUB EZ  4% CHG (MISCELLANEOUS) ×2
BRUSH SCRUB EZ 4% CHG (MISCELLANEOUS) ×1 IMPLANT
BSPLAT TIB 2 CMNT M TPR KN LT (Knees) ×1 IMPLANT
CEMENT BONE 40GM (Cement) ×4 IMPLANT
CHLORAPREP W/TINT 26 (MISCELLANEOUS) ×4 IMPLANT
COMP FEMORAL OXINIUM SZ 3 LEFT (Orthopedic Implant) ×2 IMPLANT
COMP PATELLA FENESIS 32 OVAL (Stem) ×2 IMPLANT
COMPONENT FEMRL OXINM SZ3 LT (Orthopedic Implant) IMPLANT
COMPONENT PTLLA GENS 32 OVAL (Stem) IMPLANT
COOLER POLAR GLACIER W/PUMP (MISCELLANEOUS) ×2 IMPLANT
CUFF TOURN SGL QUICK 34 (TOURNIQUET CUFF) ×2
CUFF TRNQT CYL 34X4.125X (TOURNIQUET CUFF) ×1 IMPLANT
DRAPE 3/4 80X56 (DRAPES) ×4 IMPLANT
DRAPE INCISE IOBAN 66X60 STRL (DRAPES) ×2 IMPLANT
ELECT REM PT RETURN 9FT ADLT (ELECTROSURGICAL) ×2
ELECTRODE REM PT RTRN 9FT ADLT (ELECTROSURGICAL) ×1 IMPLANT
GAUZE SPONGE 4X4 12PLY STRL (GAUZE/BANDAGES/DRESSINGS) ×2 IMPLANT
GAUZE XEROFORM 1X8 LF (GAUZE/BANDAGES/DRESSINGS) ×2 IMPLANT
GLOVE SURG ENC MOIS LTX SZ8 (GLOVE) ×2 IMPLANT
GLOVE SURG ORTHO LTX SZ8.5 (GLOVE) ×2 IMPLANT
GLOVE SURG UNDER POLY LF SZ8.5 (GLOVE) ×4 IMPLANT
GOWN STRL REUS W/ TWL LRG LVL3 (GOWN DISPOSABLE) ×1 IMPLANT
GOWN STRL REUS W/ TWL XL LVL3 (GOWN DISPOSABLE) ×1 IMPLANT
GOWN STRL REUS W/TWL LRG LVL3 (GOWN DISPOSABLE) ×2
GOWN STRL REUS W/TWL XL LVL3 (GOWN DISPOSABLE) ×2
HOOD PEEL AWAY FLYTE STAYCOOL (MISCELLANEOUS) ×4 IMPLANT
INSERT TIB XLPE 11 SZ1-2 (Insert) ×1 IMPLANT
IV NS IRRIG 3000ML ARTHROMATIC (IV SOLUTION) ×2 IMPLANT
KIT TURNOVER KIT A (KITS) ×2 IMPLANT
MANIFOLD NEPTUNE II (INSTRUMENTS) ×2 IMPLANT
MAT ABSORB  FLUID 56X50 GRAY (MISCELLANEOUS) ×2
MAT ABSORB FLUID 56X50 GRAY (MISCELLANEOUS) ×1 IMPLANT
NDL SAFETY ECLIPSE 18X1.5 (NEEDLE) ×1 IMPLANT
NDL SPNL 20GX3.5 QUINCKE YW (NEEDLE) ×1 IMPLANT
NEEDLE HYPO 18GX1.5 SHARP (NEEDLE) ×2
NEEDLE SPNL 20GX3.5 QUINCKE YW (NEEDLE) ×2 IMPLANT
NS IRRIG 1000ML POUR BTL (IV SOLUTION) ×2 IMPLANT
PACK TOTAL KNEE (MISCELLANEOUS) ×2 IMPLANT
PAD ABD DERMACEA PRESS 5X9 (GAUZE/BANDAGES/DRESSINGS) ×1 IMPLANT
PAD CAST CTTN 4X4 STRL (SOFTGOODS) IMPLANT
PAD DE MAYO PRESSURE PROTECT (MISCELLANEOUS) ×2 IMPLANT
PAD WRAPON POLAR KNEE (MISCELLANEOUS) ×1 IMPLANT
PADDING CAST COTTON 4X4 STRL (SOFTGOODS) ×2
PULSAVAC PLUS IRRIG FAN TIP (DISPOSABLE) ×2
SOLUTION PRONTOSAN WOUND 350ML (IRRIGATION / IRRIGATOR) ×2 IMPLANT
STAPLER SKIN PROX 35W (STAPLE) ×2 IMPLANT
STOCKINETTE BIAS CUT 6 980064 (GAUZE/BANDAGES/DRESSINGS) ×1 IMPLANT
SUCTION FRAZIER HANDLE 10FR (MISCELLANEOUS) ×2
SUCTION TUBE FRAZIER 10FR DISP (MISCELLANEOUS) ×1 IMPLANT
SUT DVC 2 QUILL PDO  T11 36X36 (SUTURE) ×2
SUT DVC 2 QUILL PDO T11 36X36 (SUTURE) ×1 IMPLANT
SUT VIC AB 2-0 CT1 18 (SUTURE) ×2 IMPLANT
SUT VIC AB 2-0 CT1 27 (SUTURE)
SUT VIC AB 2-0 CT1 TAPERPNT 27 (SUTURE) IMPLANT
SUT VIC AB PLUS 45CM 1-MO-4 (SUTURE) ×2 IMPLANT
SYR 30ML LL (SYRINGE) ×6 IMPLANT
TIP FAN IRRIG PULSAVAC PLUS (DISPOSABLE) ×1 IMPLANT
WATER STERILE IRR 500ML POUR (IV SOLUTION) ×2 IMPLANT
WRAPON POLAR PAD KNEE (MISCELLANEOUS) ×2

## 2022-01-20 NOTE — Op Note (Signed)
DATE OF SURGERY:  01/20/2022 ?TIME: 11:57 AM ? ?PATIENT NAME:  Christie Lin   ?AGE: 71 y.o.  ? ? 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 ?POST-OPERATIVE DIAGNOSIS:  Same ? ?PROCEDURE:  Procedure(s): ?TOTAL KNEE ARTHROPLASTY, LEFT ? ?SURGEON:  Lovell Sheehan, MD  ? ?ASSISTANT:  Carlynn Spry,  PA-C ? ?OPERATIVE IMPLANTS: [employer], Cruciate Retaining Oxinium Femoral component size  3, Fixed Bearing Tray size 2, Patella polyethylene 3-peg oval button size 32 mm, with a 11 mm DISHED insert. ? ? ?PREOPERATIVE INDICATIONS: ? ?Christie Lin is an 71 y.o. 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  Radiographs have demonstrated tricompartmental osteoarthritis joint space narrowing, osteophytes, subchondral sclerosis and cyst formation. ? ?The risks, benefits, and alternatives were discussed at length including but not limited to the risks of infection, bleeding, nerve or blood vessel injury, knee stiffness, fracture, dislocation, loosening or failure of the hardware and the need for further surgery. Medical risks include but not limited to DVT and pulmonary embolism, myocardial infarction, stroke, pneumonia, respiratory failure and death. I discussed these risks with the patient in my office prior to the date of surgery. They understood these risks and were willing to proceed. ? ?OPERATIVE FINDINGS AND UNIQUE ASPECTS OF THE CASE:  All three compartments with advanced and severe degenerative changes, large osteophytes and an abundance of synovial fluid. Significant deformity was also noted. A decision was made to proceed with total knee arthroplasty. ? ? ?OPERATIVE DESCRIPTION: ? ?The patient was brought to  the operative room and placed in a supine position after undergoing placement of a general anesthetic. IV antibiotics were given. Patient received tranexamic acid. The lower extremity was prepped and draped in the usual sterile fashion.  A time out was performed to verify the patient's name, date of birth, medical record number, correct site of surgery and correct procedure to be performed. The timeout was also used to confirm the patient received antibiotics and that appropriate instruments, implants and radiographs studies were available in the room.  The leg was elevated and exsanguinated with an Esmarch and the tourniquet was inflated to 250 mmHg. ? ?A midline incision was made over the left knee.. A medial parapatellar arthrotomy was then made and the patella subluxed laterally and the knee was brought into 90? of flexion. Hoffa's fat pad along with the anterior cruciate ligament was resected and the medial joint line was exposed. ? ?Attention was then turned to preparation of the patella. The thickness of the patella was measured with a caliper, the diameter measured with the patella templates.  The patella resection was then made with an oscillating saw using the patella cutting guide.  The 32 mm button fit appropriately.  3 peg holes for the patella component were then drilled. ? ?The extramedullary tibial cutting guide was then placed using the anterior tibial crest and second ray of the foot as a reference.  The tibial cutting guide was adjusted to allow for appropriate posterior slope.  The tibial cutting block was pinned into position. The slotted stylus was used to measure the proximal tibial resection of 9 mm off the high lateral side. Care was taken during the tibial resection to protect the medial and collateral ligaments.  The resected tibial bone was removed. ? ?  The distal femur was resected using the intramedullary cutting guide.  Care was taken to protect the collateral ligaments during distal  femoral resection.  The distal femoral resection was performed with an oscillating saw. The femoral cutting guide was then removed. Extension gap was measured with a 11 mm spacer block and alignment and extension was confirmed using a long alignment rod. The femur was sized to be a 3. Rotation of the referencing guide was checked with the epicondylar axis and Whitesides line. Then the 4-in-1 cutting jig was then applied to the distal femur. A stylus was used to confirm that the anterior femur would not be notched.   Then the anterior, posterior and chamfer femoral cuts were then made with an oscillating saw.  The knee was distracted and all posterior osteophytes were removed.  The flexion gap was then measured with a flexion spacer block and long alignment rod and was found to be symmetric with the extension gap and perpendicular to mechanical axis of the tibia. ? ?The proximal tibia plateau was then sized with trial trays. The best coverage was achieved with a size 2. This tibial tray was then pinned into position. The proximal tibia was then prepared with the keel punch.  After tibial preparation was completed, all trial components were inserted with polyethylene trials. The knee achieved full extension and flexed to 120 degrees. Ligament were stable to varus and valgus at full extension as well as 30, 60 and 90 degrees of flexion.  ? ?The trials were then placed. Knee was taken through a full range of motion and deemed to be stable with the trial components. All trial components were then removed.  The joint was copiously irrigated with pulse lavage. ? ?The final total knee arthroplasty components were then cemented into place. The knee was held in extension while cement was allowed to cure.The knee was taken through a range of motion and the patella tracked well and the knee was again irrigated copiously.  The knee capsule was then injected with Exparel. ? ?The medial arthrotomy was closed with #1 Vicryl and #2  Quill. The subcutaneous tissue closed with  2-0 vicryl, and skin approximated with staples.  A dry sterile and compressive dressing was applied.  A Polar Care was applied to the operative knee. ? ?The patient was awakened and brought to the PACU in stable and satisfactory condition.  All sharp, lap and instrument counts were correct at the conclusion the case. I spoke with the patient's family in the postop consultation room to let them know the case had been performed without complication and the patient was stable in recovery room.  ? ?Total tourniquet time was 41 minutes. ? ?

## 2022-01-20 NOTE — Anesthesia Procedure Notes (Signed)
Anesthesia Regional Block: Adductor canal block  ? ?Pre-Anesthetic Checklist: , timeout performed,  Correct Patient, Correct Site, Correct Laterality,  Correct Procedure,, site marked,  Risks and benefits discussed,  Surgical consent,  Pre-op evaluation,  At surgeon's request and post-op pain management ? ?Laterality: Left ? ?Prep: chloraprep     ?  ?Needles:  ?Injection technique: Single-shot ? ?Needle Type: Echogenic Needle   ? ? ? ? ? ? ? ?Additional Needles: ? ? ?Procedures:,,,, ultrasound used (permanent image in chart),,   ?Motor weakness within 20 minutes.  ?Narrative:  ?Start time: 01/20/2022 9:55 AM ?End time: 01/20/2022 9:56 AM ?Injection made incrementally with aspirations every 5 mL. ? ?Performed by: Personally  ?Anesthesiologist: Darrin Nipper, MD ? ?Additional Notes: ?Functioning IV was confirmed and monitors applied.  Sterile prep and drape, hand hygiene and sterile gloves were used. Ultrasound guidance: relevant anatomy identified, needle position confirmed, local anesthetic spread visualized around nerve(s), vascular puncture avoided.  Image saved to electronic medical record.  Negative aspiration prior to incremental administration of local anesthetic for total 20 ml bupivacaine 0.5% given in adductor saphenous distribution. The patient tolerated the procedure well. Vital signs and moderate sedation medications recorded in RN notes.  ? ? ? ?

## 2022-01-20 NOTE — Anesthesia Postprocedure Evaluation (Signed)
Anesthesia Post Note ? ?Patient: Christie Lin ? ?Procedure(s) Performed: TOTAL KNEE ARTHROPLASTY (Left: Knee) ? ?Patient location during evaluation: PACU ?Anesthesia Type: Spinal ?Level of consciousness: awake and alert, oriented and patient cooperative ?Pain management: pain level controlled ?Vital Signs Assessment: post-procedure vital signs reviewed and stable ?Respiratory status: spontaneous breathing, nonlabored ventilation and respiratory function stable ?Cardiovascular status: blood pressure returned to baseline and stable ?Postop Assessment: adequate PO intake ?Anesthetic complications: no ? ? ?No notable events documented. ? ? ?Last Vitals:  ?Vitals:  ? 01/20/22 1401 01/20/22 1414  ?BP: (!) 101/53 (!) 104/48  ?Pulse: 78 66  ?Resp: 16 16  ?Temp: 37.6 ?C (!) 36.2 ?C  ?SpO2: 97% 98%  ?  ?Last Pain:  ?Vitals:  ? 01/20/22 1414  ?TempSrc:   ?PainSc: 0-No pain  ? ? ?  ?  ?  ?  ?  ?  ? ?Darrin Nipper ? ? ? ? ?

## 2022-01-20 NOTE — Anesthesia Preprocedure Evaluation (Addendum)
Anesthesia Evaluation  ?Patient identified by MRN, date of birth, ID band ?Patient awake ? ? ? ?Reviewed: ?Allergy & Precautions, NPO status , Patient's Chart, lab work & pertinent test results ? ?History of Anesthesia Complications ?Negative for: history of anesthetic complications ? ?Airway ?Mallampati: II ? ? ?Neck ROM: Full ? ? ? Dental ? ?(+) Implants, Missing ?  ?Pulmonary ?neg pulmonary ROS,  ?  ?Pulmonary exam normal ?breath sounds clear to auscultation ? ? ? ? ? ? Cardiovascular ?Exercise Tolerance: Good ?Normal cardiovascular exam ?Rhythm:Regular Rate:Normal ? ?Hypotension, palpitations ? ?ECG 01/14/22: normal ?  ?Neuro/Psych ?PSYCHIATRIC DISORDERS Anxiety Depression Chronic lower back pain; scoliosis ?  ? GI/Hepatic ?negative GI ROS,   ?Endo/Other  ?negative endocrine ROS ? Renal/GU ?negative Renal ROS  ? ?  ?Musculoskeletal ? ?(+) Arthritis ,  ? Abdominal ?  ?Peds ? Hematology ? ?(+) Blood dyscrasia, anemia ,   ?Anesthesia Other Findings ?Cardiology note 02/27/21:  ?Palpitations ?Unable to exclude ectopy, versus atrial fib/flutter versus other arrhythmia such as atrial tachycardia ?We will place a ZIO monitor today ?Little room to add beta-blockers given low blood pressure ?? ?Orthostasis/near syncope ?Discussed strategies such as more fluid loading in the morning especially before her morning walk, some salt loading, compression hose, abdominal binder ?Continue fludrocortisone 0.1 mg daily ?We did discuss increasing the dose versus other medication such as midodrine ?She prefers to try midodrine 2.5 up to 10 mg up to 3 times daily as needed for blood pressure support ?Recommend she check orthostatics at home before starting the day, better hydration in the morning ?Avoid weight loss ? Reproductive/Obstetrics ?Endometriosis  ? ?  ? ? ? ? ? ? ? ? ? ? ? ? ? ?  ?  ? ? ? ? ? ? ? ?Anesthesia Physical ?Anesthesia Plan ? ?ASA: 2 ? ?Anesthesia Plan: General, Spinal and Regional   ? ?Post-op Pain Management: Regional block*  ? ?Induction: Intravenous ? ?PONV Risk Score and Plan: 3 and Propofol infusion, TIVA, Treatment may vary due to age or medical condition and Ondansetron ? ?Airway Management Planned: Natural Airway and Nasal Cannula ? ?Additional Equipment:  ? ?Intra-op Plan:  ? ?Post-operative Plan:  ? ?Informed Consent: I have reviewed the patients History and Physical, chart, labs and discussed the procedure including the risks, benefits and alternatives for the proposed anesthesia with the patient or authorized representative who has indicated his/her understanding and acceptance.  ? ? ? ? ? ?Plan Discussed with: CRNA ? ?Anesthesia Plan Comments: (Plan for preoperative adductor saphenous nerve block, spinal, and GA with natural airway, LMA/GETA backup.  Patient consented for risks of anesthesia including but not limited to:  ?- adverse reactions to medications ?- damage to eyes, teeth, lips or other oral mucosa ?- nerve damage due to positioning  ?- sore throat or hoarseness ?- headache, bleeding, infection, nerve damage 2/2 spinal ?- damage to heart, brain, nerves, lungs, other parts of body or loss of life ? ?Informed patient about role of CRNA in peri- and intra-operative care.  Patient voiced understanding.)  ? ? ? ? ? ? ?Anesthesia Quick Evaluation ? ?

## 2022-01-20 NOTE — Progress Notes (Signed)
PT Cancellation Note ? ?Patient Details ?Name: Christie Lin ?MRN: [id number] ?DOB: 12-25-1950 ? ? ?Cancelled Treatment:    Reason Eval/Treat Not Completed: Patient not medically ready Patient's order received and chart reviewed. Per RN spinal still in-tact. Minimal sensation and motor coordination at this time. Will re-attempt at a later time/date as available and patient medically appropriate for PT. Thank you! ? ? ?Iva Boop, PT  ?01/20/22. 3:07 PM ? ?

## 2022-01-20 NOTE — Progress Notes (Signed)
Notified Dr. Harlow Mares of patient's low BP. Order for 500 ml bolus of LR received.  ?

## 2022-01-20 NOTE — Transfer of Care (Signed)
Immediate Anesthesia Transfer of Care Note ? ?Patient: Christie Lin ? ?Procedure(s) Performed: TOTAL KNEE ARTHROPLASTY (Left: Knee) ? ?Patient Location: PACU ? ?Anesthesia Type:Spinal ? ?Level of Consciousness: awake, drowsy and patient cooperative ? ?Airway & Oxygen Therapy: Patient Spontanous Breathing ? ?Post-op Assessment: Report given to RN and Post -op Vital signs reviewed and stable ? ?Post vital signs: Reviewed and stable ? ?Last Vitals:  ?Vitals Value Taken Time  ?BP 93/48 01/20/22 1149  ?Temp    ?Pulse 91 01/20/22 1150  ?Resp 13 01/20/22 1150  ?SpO2 98 % 01/20/22 1150  ?Vitals shown include unvalidated device data. ? ?Last Pain:  ?Vitals:  ? 01/20/22 0907  ?TempSrc: Oral  ?PainSc: 3   ?   ? ?  ? ?Complications: No notable events documented. ?

## 2022-01-20 NOTE — H&P (Signed)
The patient has been re-examined, and the chart reviewed, and there have been no interval changes to the documented history and physical.  Plan a left total knee today.  Anesthesia is consulted regarding a peripheral nerve block for post-operative pain.  The risks, benefits, and alternatives have been discussed at length, and the patient is willing to proceed.     

## 2022-01-20 NOTE — Anesthesia Procedure Notes (Signed)
Spinal ? ?Patient location during procedure: OR ?Reason for block: surgical anesthesia ?Staffing ?Performed: resident/CRNA  ?Anesthesiologist: Darrin Nipper, MD ?Resident/CRNA: Rolla Plate, CRNA ?Preanesthetic Checklist ?Completed: patient identified, IV checked, site marked, risks and benefits discussed, surgical consent, monitors and equipment checked, pre-op evaluation and timeout performed ?Spinal Block ?Patient position: sitting ?Prep: ChloraPrep and site prepped and draped ?Patient monitoring: heart rate, continuous pulse ox, blood pressure and cardiac monitor ?Approach: midline ?Location: L4-5 ?Injection technique: single-shot ?Needle ?Needle type: Whitacre and Introducer  ?Needle gauge: 24 G ?Needle length: 9 cm ?Assessment ?Events: CSF return ?Additional Notes ?Negative paresthesia. Negative blood return. Positive free-flowing CSF. Expiration date of kit checked and confirmed. Patient tolerated procedure well, without complications. ? ? ? ? ? ?

## 2022-01-21 ENCOUNTER — Encounter: Payer: Self-pay | Admitting: Orthopedic Surgery

## 2022-01-21 DIAGNOSIS — M1712 Unilateral primary osteoarthritis, left knee: Secondary | ICD-10-CM | POA: Diagnosis not present

## 2022-01-21 LAB — ABO/RH: ABO/RH(D): A POS

## 2022-01-21 MED ORDER — METOCLOPRAMIDE HCL 5 MG/ML IJ SOLN
INTRAMUSCULAR | Status: AC
  Filled 2022-01-21: qty 2

## 2022-01-21 MED ORDER — ONDANSETRON HCL 4 MG/2ML IJ SOLN
INTRAMUSCULAR | Status: AC
  Administered 2022-01-21: 4 mg via INTRAVENOUS
  Filled 2022-01-21: qty 2

## 2022-01-21 MED ORDER — DOCUSATE SODIUM 100 MG PO CAPS: 100.0000 mg | ORAL_CAPSULE | Freq: Two times a day (BID) | ORAL | 0 refills | Status: DC

## 2022-01-21 MED ORDER — OXYCODONE HCL 5 MG PO TABS
ORAL_TABLET | ORAL | Status: AC
  Administered 2022-01-21: 5 mg via ORAL
  Filled 2022-01-21: qty 1

## 2022-01-21 MED ORDER — OXYCODONE HCL 5 MG PO TABS: 5.0000 mg | ORAL_TABLET | ORAL | 0 refills | Status: DC | PRN

## 2022-01-21 MED ORDER — ACETAMINOPHEN 325 MG PO TABS
ORAL_TABLET | ORAL | Status: AC
  Filled 2022-01-21: qty 2

## 2022-01-21 MED ORDER — OXYCODONE HCL 5 MG PO TABS
ORAL_TABLET | ORAL | Status: AC
  Administered 2022-01-21: 10 mg via ORAL
  Filled 2022-01-21: qty 2

## 2022-01-21 MED ORDER — ASPIRIN 81 MG PO CHEW
CHEWABLE_TABLET | ORAL | Status: AC
  Administered 2022-01-21: 81 mg via ORAL
  Filled 2022-01-21: qty 1

## 2022-01-21 MED ORDER — METHOCARBAMOL 500 MG PO TABS
ORAL_TABLET | ORAL | Status: AC
  Filled 2022-01-21: qty 1

## 2022-01-21 MED ORDER — ASPIRIN 81 MG PO CHEW: 81.0000 mg | CHEWABLE_TABLET | Freq: Two times a day (BID) | ORAL | 0 refills | Status: DC

## 2022-01-21 MED ORDER — TIZANIDINE HCL 4 MG PO TABS: 4.0000 mg | ORAL_TABLET | Freq: Three times a day (TID) | ORAL | 1 refills | Status: DC

## 2022-01-21 MED ORDER — DOCUSATE SODIUM 100 MG PO CAPS
ORAL_CAPSULE | ORAL | Status: AC
Start: 2022-01-21 — End: 2022-01-21
  Administered 2022-01-21: 100 mg via ORAL
  Filled 2022-01-21: qty 1

## 2022-01-21 NOTE — Discharge Summary (Signed)
Physician Discharge Summary  ?Patient ID: ?Christie Lin ?MRN: [id number] ?DOB/AGE: Sep 03, 1951 71 y.o. ? ?Admit date: 01/20/2022 ?Discharge date: 01/21/2022 ? 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  S/P TKR (total knee replacement) using cement, left ? ? ?Past Medical History:  ?Diagnosis Date  ? Anemia   ? Blood transfusion without reported diagnosis   ? DDD (degenerative disc disease), lumbar 06/20/2017  ? L3-4  ? Depression   ? grief reaction  ? Endometriosis   ? Fall   ? fell from broken sidewalk  ? Hypotension   ? per pt she has fainted from low b/p in the past  ? Low back pain   ? Palpitations 2022  ? no medications  ? Scoliosis   ? ? ?Surgeries: Procedure(s): ?TOTAL KNEE ARTHROPLASTY on 01/20/2022 ?  ?Consultants (if any):  ? ?Discharged Condition: Improved ? ?Hospital Course: Christie Lin is an 71 y.o. 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   ? ?She was given perioperative antibiotics:  ?Anti-infectives (From admission, onward)  ? ? Start     Dose/Rate Route Frequency Ordered Stop  ? 01/20/22 1215  vancomycin (VANCOCIN) IVPB 1000 mg/200 mL premix  Status:  Discontinued       ? 1,000 mg ?200 mL/hr over 60 Minutes Intravenous  Once 01/20/22 1213 01/20/22 1222  ? 01/20/22 0945  vancomycin (VANCOCIN) IVPB 1000 mg/200 mL premix  Status:  Discontinued       ? 1,000 mg ?200 mL/hr over 60 Minutes Intravenous On call to O.R. 01/20/22 0932 01/20/22 1410  ? 01/20/22 0852  clindamycin (CLEOCIN) 900 MG/50ML IVPB       ?Note to Pharmacy: Herby Abraham W: cabinet override  ?    01/20/22 0852 01/20/22 1026  ? 01/20/22 0600   clindamycin (CLEOCIN) IVPB 900 mg       ? 900 mg ?100 mL/hr over 30 Minutes Intravenous On call to O.R. 01/19/22 2159 01/20/22 1040  ? ?  ?. ? ?She was given sequential compression devices, early ambulation, and aspirin 81 mg twice daily for 30 days for DVT prophylaxis. ? ?She benefited maximally from the hospital stay and there were no complications.   ? ?Recent vital signs:  ?Vitals:  ? 01/20/22 2356 01/21/22 0429  ?BP: (!) 107/59 (!) 106/56  ?Pulse: 65 64  ?Resp: 17 16  ?Temp: 97.7 ?F (36.5 ?C) 98.6 ?F (37 ?C)  ?SpO2: 98% 97%  ? ? ?Recent laboratory studies:  ?Lab Results  ?Component Value Date  ? HGB 13.1 01/14/2022  ? HGB 12.9 03/17/2021  ? HGB 13.1 02/02/2021  ? ?Lab Results  ?Component Value Date  ? WBC 4.4 01/14/2022  ? PLT 173 01/14/2022  ? ?Lab Results  ?Component Value Date  ? INR 1.0 06/14/2020  ? ?Lab Results  ?Component Value Date  ? NA 138 01/14/2022  ? K 3.8 01/14/2022  ? CL 106 01/14/2022  ? CO2 29 01/14/2022  ? BUN 14 01/14/2022  ? CREATININE 0.80 01/14/2022  ? GLUCOSE 96 01/14/2022  ? ? ?Discharge Medications:   ?Allergies as of 01/21/2022   ? ?   Reactions  ? Hydrocodone-acetaminophen Other (See Comments)  ? Drop BP  ?  Penicillins Swelling, Rash  ? Throat swelling, rash  ? ?  ? ?  ?Medication List  ?  ? ?STOP taking these medications   ? ?methocarbamol 500 MG tablet ?Commonly known as: Robaxin ?  ? ?  ? ?TAKE these medications   ? ?aspirin 81 MG chewable tablet ?Chew 1 tablet (81 mg total) by mouth 2 (two) times daily. ?  ?citalopram 20 MG tablet ?Commonly known as: CELEXA ?TAKE 1 TABLET BY MOUTH EVERY OTHER DAY. ?  ?docusate sodium 100 MG capsule ?Commonly known as: COLACE ?Take 1 capsule (100 mg total) by mouth 2 (two) times daily. ?  ?fludrocortisone 0.1 MG tablet ?Commonly known as: FLORINEF ?Take 1 tablet (0.1 mg total) by mouth daily. ?  ?MELATONIN PO ?Take 1 tablet by mouth at bedtime as needed (sleep). ?  ?meloxicam 15 MG tablet ?Commonly known as: MOBIC ?Take 0.5-1 tablets (7.5-15 mg  total) by mouth daily as needed for pain. With a meal ?  ?midodrine 10 MG tablet ?Commonly known as: PROAMATINE ?Take 1 tablet (10 mg total) by mouth 3 (three) times daily as needed. ?  ?oxyCODONE 5 MG immediate release tablet ?Commonly known as: Oxy IR/ROXICODONE ?Take 1-2 tablets (5-10 mg total) by mouth every 4 (four) hours as needed for moderate pain or severe pain (pain score 4-6). ?  ?tiZANidine 4 MG tablet ?Commonly known as: Zanaflex ?Take 1 tablet (4 mg total) by mouth 3 (three) times daily. ?  ? ?  ? ?  ?  ? ? ?  ?Durable Medical Equipment  ?(From admission, onward)  ?  ? ? ?  ? ?  Start     Ordered  ? 01/21/22 0659  For home use only DME 3 n 1  Once       ? 01/21/22 0658  ? 01/21/22 0659  For home use only DME Walker rolling  Once       ?Question Answer Comment  ?Walker: With 5 Inch Wheels   ?Patient needs a walker to treat with the following condition Osteoarthritis of left knee   ?  ? 01/21/22 0658  ? ?  ?  ? ?  ? ? ?Diagnostic Studies: DG Knee Left Port ? ?Result Date: 01/20/2022 ?CLINICAL DATA:  Status post left knee total arthroplasty EXAM: PORTABLE LEFT KNEE - 1-2 VIEW COMPARISON:  None. FINDINGS: Status post left knee total arthroplasty with expected overlying postoperative change. No evidence of perihardware fracture or component malpositioning. IMPRESSION: Status post left knee total arthroplasty with expected overlying postoperative change. No evidence of perihardware fracture or component malpositioning. Electronically Signed   By: Delanna Ahmadi M.D.   On: 01/20/2022 12:19  ? ?[place] OR NERVE BLOCK-IMAGE ONLY [facility]) ? ?Result Date: 01/20/2022 ?There is no interpretation for this exam.  This order is for images obtained during a surgical procedure.  Please See "Surgeries" Tab for more information regarding the procedure.   ? ?Disposition: Discharge disposition: 01-Home or Self Care ? ? ? ? ? ? ? ? ? ? ? ?Signed: ?Carlynn Spry ,PA-C ?01/21/2022, 6:59 AM ? ?  ?

## 2022-01-21 NOTE — Discharge Instructions (Signed)
Continue weight bear as tolerated on the left lower extremity.    Elevate the left lower extremity whenever possible and continue the polar care while elevating the extremity. Patient may shower. No bath or submerging the wound.    Take aspirin 81 mg twice daily for 30 days as directed for blood clot prevention.  Continue to work on knee range of motion exercises at home as instructed by physical therapy. Continue to use a walker for assistance with ambulation until cleared by physical therapy.  Call [phone] with any questions, such as fever > 101.5 degrees, drainage from the wound or shortness of breath. 

## 2022-01-21 NOTE — Evaluation (Signed)
Physical Therapy Evaluation ?Patient Details ?Name: Christie Lin ?MRN: [id number] ?DOB: Jun 10, 1951 ?Today's Date: 01/21/2022 ? ?History of Present Illness ? Patient is a 71 year old female s/p TKA on the left.  ?Clinical Impression ? Physical Therapy Evaluation completed this date. Patient tolerated session fairly well and was agreeable to treatment. Upon arrival patient was seated in recliner with family and RN present. RN reported patient has just received her pain medication. Patient lives in a 2 story home with her husband, and has about 2 STE. Stairs have B handrails, however steps are to wide to reach both at the same time. Husband still works, so is able to provide Intermittent assistance, daughter lives 20 minutes down the street, son lives close by, and third daughter is coming to stay for a week to provide increased assistance. Patient reports being Mod I/ Independent prior to hospitalization, and did not ambulate with an AD at baseline. Patient currently has a RW at home.  ? ?Patient demonstrated [facility] BUE strength, and at least 3to3+/5 strength in BLEs. LLE demonstrated decreased ROM and strength due to pain. Patient was able to complete 50 degrees of knee flexion actively at beginning of session. HEP reviewed with patient and family. Both verbalized understanding. Patient required CGA to complete sit to stand from recliner with RW and cueing on hand placement. Increased time to come to complete standing was required. Patient was able to complete ~73fet with RW at CGA/SBA with a chair follow due to pain and fatigue. Patient was then rolled back to room and left in recliner with all needs met and in reach with family present. Will need to educate on steps and continue with ambulation in PM session. Patient would continue to benefit from skilled physical therapy in order to optimize patient's return to PLOF. Recommend HHPT upon discharge from acute hospitalization.  ?   ? ?Recommendations for follow up  therapy are one component of a multi-disciplinary discharge planning process, led by the attending physician.  Recommendations may be updated based on patient status, additional functional criteria and insurance authorization. ? ?Follow Up Recommendations Home health PT ? ?  ?Assistance Recommended at Discharge Intermittent Supervision/Assistance  ?Patient can return home with the following ? A little help with walking and/or transfers;Help with stairs or ramp for entrance;A little help with bathing/dressing/bathroom ? ?  ?Equipment Recommendations None recommended by PT (Patient has all required DME at home)  ?Recommendations for Other Services ?    ?  ?Functional Status Assessment Patient has had a recent decline in their functional status and demonstrates the ability to make significant improvements in function in a reasonable and predictable amount of time.  ? ?  ?Precautions / Restrictions Precautions ?Precautions: Fall ?Restrictions ?Weight Bearing Restrictions: Yes ?LLE Weight Bearing: Weight bearing as tolerated  ? ?  ? ?Mobility ? Bed Mobility ?  ?  ?  ?  ?  ?  ?  ?General bed mobility comments: No assessed as patient started and ended session in recliner ?Patient Response: Cooperative ? ?Transfers ?Overall transfer level: Needs assistance ?Equipment used: Rolling walker (2 wheels) ?Transfers: Sit to/from Stand ?Sit to Stand: Min guard ?  ?  ?  ?  ?  ?General transfer comment: cueing on hand placement ?  ? ?Ambulation/Gait ?Ambulation/Gait assistance: Min guard (SBA) ?Gait Distance (Feet): 76 Feet ?Assistive device: Rolling walker (2 wheels) ?Gait Pattern/deviations: Step-through pattern, Decreased step length - right, Decreased step length - left, Decreased stride length, Narrow base of support, Antalgic ?Gait  velocity: decreased ?  ?  ?General Gait Details: guarded posture during ambulation, cueing on relaxation of shoulders, no LOB noted ? ?Stairs ?  ?  ?  ?  ?  ? ?Wheelchair Mobility ?  ? ?Modified  Rankin (Stroke Patients Only) ?  ? ?  ? ?Balance Overall balance assessment: Needs assistance ?Sitting-balance support: Feet supported, Bilateral upper extremity supported ?Sitting balance-Leahy Scale: Fair ?  ?  ?Standing balance support: Bilateral upper extremity supported, During functional activity, Reliant on assistive device for balance ?Standing balance-Leahy Scale: Fair ?Standing balance comment: Good static standing, CGA during ambualtion ?  ?  ?  ?  ?  ?  ?  ?  ?  ?  ?  ?   ? ? ? ?Pertinent Vitals/Pain Pain Assessment ?Pain Assessment: 0-10 ?Pain Score: 3  ?Pain Descriptors / Indicators: Discomfort, Constant ?Pain Intervention(s): Monitored during session, Limited activity within patient's tolerance, Repositioned  ? ? ?Home Living Family/patient expects to be discharged to:: Private residence ?Living Arrangements: Spouse/significant other ?Available Help at Discharge: Family;Available PRN/intermittently;Available 24 hours/day ?Type of Home: House ?Home Access: Stairs to enter ?Entrance Stairs-Rails: Right;Left ?Entrance Stairs-Number of Steps: 2 ?  ?Home Layout: Two level;Able to live on main level with bedroom/bathroom ?Home Equipment: Grab bars - toilet;Rolling Walker (2 wheels) ?   ?  ?Prior Function   ?  ?  ?  ?  ?  ?  ?Mobility Comments: no AD at baseline ?  ?  ? ? ?Hand Dominance  ? Dominant Hand: Right ? ?  ?Extremity/Trunk Assessment  ? Upper Extremity Assessment ?Upper Extremity Assessment: Overall WFL for tasks assessed ?  ? ?Lower Extremity Assessment ?Lower Extremity Assessment: Generalized weakness;LLE deficits/detail (at least 3to3+/5 bilaterally) ?LLE Deficits / Details: Decreased ROM and strength due to pain ?LLE: Unable to fully assess due to pain ?  ? ?   ?Communication  ? Communication: No difficulties  ?Cognition Arousal/Alertness: Awake/alert ?Behavior During Therapy: [facility] for tasks assessed/performed ?Overall Cognitive Status: Within Functional Limits for tasks assessed ?  ?  ?  ?  ?   ?  ?  ?  ?  ?  ?  ?  ?  ?  ?  ?  ?General Comments: A&Ox3 self location situation ?  ?  ? ?  ?General Comments General comments (skin integrity, edema, etc.): SpO2 remained >90% on room air, HR ranged from 66-74bpm ? ?  ?Exercises Total Joint Exercises ?Goniometric ROM: 50 degrees of knee flexion ?Other Exercises ?Other Exercises: patient educated on role of PT in acute setting, fall risk, d/c recommendations, WBing precautions, and techniques on how to get into/out of the car  ? ?Assessment/Plan  ?  ?PT Assessment Patient needs continued PT services  ?PT Problem List Decreased strength;Decreased range of motion;Pain;Decreased balance ? ?   ?  ?PT Treatment Interventions DME instruction;Therapeutic activities;Therapeutic exercise;Gait training;Stair training;Balance training   ? ?PT Goals (Current goals can be found in the Care Plan section)  ?Acute Rehab PT Goals ?Patient Stated Goal: to go home ?PT Goal Formulation: With patient ?Time For Goal Achievement: 02/04/22 ?Potential to Achieve Goals: Good ? ?  ?Frequency BID ?  ? ? ?Co-evaluation   ?  ?  ?  ?  ? ? ?  ?AM-PAC PT "6 Clicks" Mobility  ?Outcome Measure Help needed turning from your back to your side while in a flat bed without using bedrails?: A Little ?Help needed moving from lying on your back to sitting on the side of a  flat bed without using bedrails?: A Little ?Help needed moving to and from a bed to a chair (including a wheelchair)?: A Little ?Help needed standing up from a chair using your arms (e.g., wheelchair or bedside chair)?: A Little ?Help needed to walk in hospital room?: A Little ?Help needed climbing 3-5 steps with a railing? : A Lot ?6 Click Score: 17 ? ?  ?End of Session Equipment Utilized During Treatment: Gait belt ?Activity Tolerance: Patient tolerated treatment well;Patient limited by pain ?Patient left: in chair;with call bell/phone within reach;with family/visitor present ?Nurse Communication: Mobility status ?PT Visit Diagnosis:  Unsteadiness on feet (R26.81);Muscle weakness (generalized) (M62.81);Pain ?Pain - Right/Left: Left ?Pain - part of body: Knee ?  ? ?Time: 1504-1364 ?PT Time Calculation (min) (ACUTE ONLY): 47 min ? ? ?Charges:

## 2022-01-21 NOTE — Progress Notes (Signed)
Physical Therapy Treatment ?Patient Details ?Name: Christie Lin ?MRN: [id number] ?DOB: November 20, 1950 ?Today's Date: 01/21/2022 ? ? ?History of Present Illness Patient is a 71 year old female s/p TKA on the left. ? ?  ?PT Comments  ? ? Physical Therapy Session completed this date. Patient tolerated session well and was agreeable to treatment. Patient only reported 4/10 pain at L knee throughout session. Patient demonstrated SUP for supine to sit bed mobility, however required Min A to complete sit to supine (with assistance at the ankles). Sit to stand from EOB was completed at SBA/SUP, and good recall of hand placement was noted. Patient tolerated increased distance during ambulation this session, completing ~27fet with RW at SBA/SUP and no LOB noted. Patient was educated on proper stair sequencing, and ascended and descended 4 steps with L hand rails with BUE support safely. Patient was left in bed with all need met and in reach and family present. Patient would continue to benefit from skilled physical therapy in order to optimize patient's return to PLOF. Continue to recommend HHPT upon discharge from acute hospitalization.  ?  ?Recommendations for follow up therapy are one component of a multi-disciplinary discharge planning process, led by the attending physician.  Recommendations may be updated based on patient status, additional functional criteria and insurance authorization. ? ?Follow Up Recommendations ? Home health PT ?  ?  ?Assistance Recommended at Discharge Intermittent Supervision/Assistance  ?Patient can return home with the following A little help with walking and/or transfers;Help with stairs or ramp for entrance;A little help with bathing/dressing/bathroom ?  ?Equipment Recommendations ? None recommended by PT  ?  ?Recommendations for Other Services   ? ? ?  ?Precautions / Restrictions Precautions ?Precautions: Fall ?Restrictions ?Weight Bearing Restrictions: Yes ?LLE Weight Bearing: Weight  bearing as tolerated  ?  ? ?Mobility ? Bed Mobility ?Overal bed mobility: Needs Assistance ?Bed Mobility: Supine to Sit, Sit to Supine ?  ?  ?Supine to sit: Supervision (no physical assistance required) ?Sit to supine: Min guard (minimal assistance at B ankles) ?  ?General bed mobility comments: patient required Mod A to reposition in bed, patient able to push through RLE, and required assistance at chuck pad ?Patient Response: Cooperative ? ?Transfers ?Overall transfer level: Needs assistance ?Equipment used: Rolling walker (2 wheels) ?Transfers: Sit to/from Stand ?Sit to Stand: Supervision ?  ?  ?  ?  ?  ?General transfer comment: good recall on hand placement ?  ? ?Ambulation/Gait ?Ambulation/Gait assistance: Supervision (SBA) ?Gait Distance (Feet): 280 Feet ?Assistive device: Rolling walker (2 wheels) ?Gait Pattern/deviations: Step-through pattern, Decreased step length - right, Decreased step length - left, Decreased stride length, Narrow base of support, Antalgic ?Gait velocity: decreased ?  ?  ?General Gait Details: guarded posture during ambulation, cueing on relaxation of shoulders, no LOB noted ? ? ?Stairs ?Stairs: Yes ?Stairs assistance: Min guard ?Stair Management: One rail Left, Step to pattern ?Number of Stairs: 4 ?General stair comments: patient educated on proper stair sequencing, required BUE support on L hand rails to complete safely ? ? ?Wheelchair Mobility ?  ? ?Modified Rankin (Stroke Patients Only) ?  ? ? ?  ?Balance Overall balance assessment: Needs assistance ?Sitting-balance support: Feet supported, Bilateral upper extremity supported ?Sitting balance-Leahy Scale: Fair ?  ?  ?Standing balance support: Bilateral upper extremity supported, During functional activity, Reliant on assistive device for balance ?Standing balance-Leahy Scale: Fair ?Standing balance comment: Good static standing, SBA during ambualtion ?  ?  ?  ?  ?  ?  ?  ?  ?  ?  ?  ?  ? ?  ?  Cognition Arousal/Alertness:  Awake/alert ?Behavior During Therapy: [facility] for tasks assessed/performed ?Overall Cognitive Status: Within Functional Limits for tasks assessed ?  ?  ?  ?  ?  ?  ?  ?  ?  ?  ?  ?  ?  ?  ?  ?  ?General Comments: Pleasant to work with ?  ?  ? ?  ?Exercises Total Joint Exercises ?Goniometric ROM: 50 degrees of knee flexion ?Other Exercises ?Other Exercises: patient educated on role of PT in acute setting, fall risk, d/c recommendations, WBing precautions, and techniques on how to get into/out of the car ? ?  ?General Comments General comments (skin integrity, edema, etc.): SpO2 remained >90% on room air, HR ranged from 66-74bpm ?  ?  ? ?Pertinent Vitals/Pain Pain Assessment ?Pain Assessment: 0-10 ?Pain Score: 4  ?Pain Location: L knee ?Pain Descriptors / Indicators: Discomfort, Constant ?Pain Intervention(s): Monitored during session, Limited activity within patient's tolerance, Repositioned  ? ? ?Home Living   ?  ?  ?  ?  ?  ?  ?  ?  ?  ?   ?  ?Prior Function    ?  ?  ?   ? ?PT Goals (current goals can now be found in the care plan section) Acute Rehab PT Goals ?Patient Stated Goal: to go home ?PT Goal Formulation: With patient ?Time For Goal Achievement: 02/04/22 ?Potential to Achieve Goals: Good ?Progress towards PT goals: Progressing toward goals ? ?  ?Frequency ? ? ? BID ? ? ? ?  ?PT Plan Current plan remains appropriate  ? ? ?Co-evaluation   ?  ?  ?  ?  ? ?  ?AM-PAC PT "6 Clicks" Mobility   ?Outcome Measure ? Help needed turning from your back to your side while in a flat bed without using bedrails?: A Little ?Help needed moving from lying on your back to sitting on the side of a flat bed without using bedrails?: A Little ?Help needed moving to and from a bed to a chair (including a wheelchair)?: A Little ?Help needed standing up from a chair using your arms (e.g., wheelchair or bedside chair)?: A Little ?Help needed to walk in hospital room?: A Little ?Help needed climbing 3-5 steps with a railing? : A Lot ?6  Click Score: 17 ? ?  ?End of Session Equipment Utilized During Treatment: Gait belt ?Activity Tolerance: Patient tolerated treatment well;Patient limited by pain ?Patient left: in bed;with call bell/phone within reach;with family/visitor present ?Nurse Communication: Mobility status ?PT Visit Diagnosis: Unsteadiness on feet (R26.81);Muscle weakness (generalized) (M62.81);Pain ?Pain - Right/Left: Left ?Pain - part of body: Knee ?  ? ? ?Time: 0370-9643 ?PT Time Calculation (min) (ACUTE ONLY): 27 min ? ?Charges:  $Gait Training: 8-22 mins ?$Therapeutic Activity: 8-22 mins          ?          ? ?Iva Boop, PT  ?01/21/22. 1:18 PM ? ? ?

## 2022-01-21 NOTE — Progress Notes (Signed)
?  Subjective: ? ?Patient reports pain as moderate.   ? ?Objective:  ? ?VITALS:   ?Vitals:  ? 01/20/22 1843 01/20/22 2000 01/20/22 2356 01/21/22 0429  ?BP: (!) 93/43 (!) 97/46 (!) 107/59 (!) 106/56  ?Pulse: (!) 52 68 65 64  ?Resp: _0 ?Temp:  98.1 ?F (36.7 ?C) 97.7 ?F (36.5 ?C) 98.6 ?F (37 ?C)  ?TempSrc:  Temporal Temporal Temporal  ?SpO2: 96% 98% 98% 97%  ?Weight:      ?Height:      ? ? ?PHYSICAL EXAM: ? ?Neurologically intact ?ABD soft ?Neurovascular intact ?Sensation intact distally ?Intact pulses distally ?Dorsiflexion/Plantar flexion intact ?Incision: dressing C/D/I ?No cellulitis present ?Compartment soft ? ?LABS ? ?Results for orders placed or performed during the hospital encounter of 01/20/22 (from the past 24 hour(s))  ?ABO/Rh     Status: None  ? Collection Time: 01/20/22  9:40 AM  ?Result Value Ref Range  ? ABO/RH(D)    ?  A POS ?Performed at [facility], [address], [place], [place] [address] ?  ? ? ?DG Knee Left Port ? ?Result Date: 01/20/2022 ?CLINICAL DATA:  Status post left knee total arthroplasty EXAM: PORTABLE LEFT KNEE - 1-2 VIEW COMPARISON:  None. FINDINGS: Status post left knee total arthroplasty with expected overlying postoperative change. No evidence of perihardware fracture or component malpositioning. IMPRESSION: Status post left knee total arthroplasty with expected overlying postoperative change. No evidence of perihardware fracture or component malpositioning. Electronically Signed   By: Delanna Ahmadi M.D.   On: 01/20/2022 12:19  ? ?[place] OR NERVE BLOCK-IMAGE ONLY [facility]) ? ?Result Date: 01/20/2022 ?There is no interpretation for this exam.  This order is for images obtained during a surgical procedure.  Please See "Surgeries" Tab for more information regarding the procedure.   ? ?Assessment/Plan: ?1 Day Post-Op  ? ?Principal Problem: ?  S/P TKR (total knee replacement) using cement, left ? ? ?Advance diet ?Up with therapy ?D/C home today if PT goals met ? ? ?Carlynn Spry , PA-C ?01/21/2022, 6:50 AM ? ? ? ? ?  ?

## 2022-01-24 ENCOUNTER — Emergency Department
Admission: EM | Admit: 2022-01-24 | Discharge: 2022-01-24 | Disposition: A | Discharge status: Home / Self Care | Payer: PPO | Attending: Emergency Medicine | Admitting: Emergency Medicine

## 2022-01-24 ENCOUNTER — Other Ambulatory Visit: Payer: Self-pay

## 2022-01-24 ENCOUNTER — Emergency Department: Payer: PPO

## 2022-01-24 DIAGNOSIS — M7989 Other specified soft tissue disorders: Secondary | ICD-10-CM | POA: Diagnosis not present

## 2022-01-24 DIAGNOSIS — M79605 Pain in left leg: Secondary | ICD-10-CM | POA: Diagnosis not present

## 2022-01-24 DIAGNOSIS — M79662 Pain in left lower leg: Secondary | ICD-10-CM | POA: Diagnosis present

## 2022-01-24 DIAGNOSIS — Z96652 Presence of left artificial knee joint: Secondary | ICD-10-CM | POA: Diagnosis not present

## 2022-01-24 DIAGNOSIS — L7631 Postprocedural hematoma of skin and subcutaneous tissue following a dermatologic procedure: Secondary | ICD-10-CM | POA: Diagnosis not present

## 2022-01-24 DIAGNOSIS — R609 Edema, unspecified: Secondary | ICD-10-CM

## 2022-01-24 NOTE — ED Notes (Signed)
Dc ppw provided. Pt questions answered, pt provides verbal consent for dc at this time. Pt assisted to lobby In wheelchair alert and oriented x4.  ?

## 2022-01-24 NOTE — ED Provider Notes (Signed)
? ?  [facility] ?Provider Note ? ? ? Event Date/Time  ? First MD Initiated Contact with Patient 01/24/22 [phone]   ?  (approximate) ? ?History  ? ?Chief Complaint: Post-op Problem ? ?HPI ? ?Christie Lin is a 71 y.o. female with a past medical history of a recent knee replacement 4 days ago who presents to the emergency department for left leg swelling.  According to the patient she has been using her icing sleeve, has been elevating the leg but continues to have increased swelling of the left lower extremity.  Patient was concerned that she could have a blood clot so she came to the emergency department for evaluation.  No history of blood clots previously.  No chest pain or shortness of breath.  Overall patient appears well. ? ?Physical Exam  ? ?Triage Vital Signs: ?ED Triage Vitals  ?Enc Vitals Group  ?   BP 01/24/22 0906 (!) 125/56  ?   Pulse Rate 01/24/22 0905 79  ?   Resp 01/24/22 0905 20  ?   Temp 01/24/22 0905 98.6 ?F (37 ?C)  ?   Temp Source 01/24/22 0905 Oral  ?   SpO2 01/24/22 0905 96 %  ?   Weight 01/24/22 0905 123 lb 7.3 oz (56 kg)  ?   Height 01/24/22 0905 5' 5.5" (1.664 m)  ?   Head Circumference --   ?   Peak Flow --   ?   Pain Score 01/24/22 0906 10  ?   Pain Loc --   ?   Pain Edu? --   ?   Excl. in [place]? --   ? ? ?Most recent vital signs: ?Vitals:  ? 01/24/22 0905 01/24/22 0906  ?BP:  (!) 125/56  ?Pulse: 79 79  ?Resp: 20   ?Temp: 98.6 ?F (37 ?C)   ?SpO2: 96% 97%  ? ? ?General: Awake, no distress.  ?CV:  Good peripheral perfusion.  Regular rate and rhythm  ?Resp:  Normal effort.  Equal breath sounds bilaterally.  ?Abd:  No distention.  Soft, nontender.  No rebound or guarding. ?Other:  Patient has mild lower extremity swelling of the left leg with a compressive Ace bandage in place. ? ? ?ED Results / Procedures / Treatments  ? ?RADIOLOGY ? ?Ultrasound negative for DVT ? ? ?MEDICATIONS ORDERED IN ED: ?Medications - No data to display ? ? ?IMPRESSION / MDM / ASSESSMENT AND PLAN / ED  COURSE  ?I reviewed the triage vital signs and the nursing notes. ? ?Patient presents emergency department for left leg swelling several days after a left knee replacement by Dr. Harlow Mares.  Overall the patient appears well, reassuring physical exam.  No chest pain or shortness of breath.  No history of DVT previously.  Patient does have mild swelling of the left lower extremity this could just be postoperative swelling however DVT remains on the differential.  We will obtain an ultrasound to rule out DVT.  If ultrasound is negative for DVT we will have the patient continue to ice and elevate the leg to help reduce swelling. ? ?Ultrasound is negative for DVT.  Discussed with the patient keeping the leg elevated using her icing sleeve.  Patient will follow-up with her doctor. ? ?FINAL CLINICAL IMPRESSION(S) / ED DIAGNOSES  ? ?Postoperative swelling ?Left leg pain ? ? ?Note:  This document was prepared using Dragon voice recognition software and may include unintentional dictation errors. ?  ?Harvest Dark, MD ?01/24/22 1137 ? ?

## 2022-01-24 NOTE — Discharge Instructions (Signed)
As we discussed please continue to use your icing sleeve, and keep your leg elevated above your heart when you are able.  Return to the emergency department for any symptom personally concerning to yourself. ?

## 2022-01-24 NOTE — ED Triage Notes (Addendum)
Pt here with post op complications. Pt had a recent left knee replacement and now has severe swelling from her knee up to her hip. Pt is concerned about a possible DVT. ?

## 2022-01-28 DIAGNOSIS — M25662 Stiffness of left knee, not elsewhere classified: Secondary | ICD-10-CM | POA: Diagnosis not present

## 2022-01-28 DIAGNOSIS — Z96659 Presence of unspecified artificial knee joint: Secondary | ICD-10-CM | POA: Diagnosis not present

## 2022-02-01 ENCOUNTER — Encounter: Payer: Self-pay | Admitting: Orthopedic Surgery

## 2022-02-03 DIAGNOSIS — M25662 Stiffness of left knee, not elsewhere classified: Secondary | ICD-10-CM | POA: Diagnosis not present

## 2022-02-03 DIAGNOSIS — Z96659 Presence of unspecified artificial knee joint: Secondary | ICD-10-CM | POA: Diagnosis not present

## 2022-02-09 ENCOUNTER — Other Ambulatory Visit: Payer: Self-pay | Admitting: Family Medicine

## 2022-02-09 NOTE — Telephone Encounter (Signed)
Is this okay to refill? ?Last filled on 11/12/21 ?Last ov 08/04/21  ?Next ov: none  ?

## 2022-02-10 DIAGNOSIS — M25662 Stiffness of left knee, not elsewhere classified: Secondary | ICD-10-CM | POA: Diagnosis not present

## 2022-02-10 DIAGNOSIS — Z96659 Presence of unspecified artificial knee joint: Secondary | ICD-10-CM | POA: Diagnosis not present

## 2022-02-15 DIAGNOSIS — M25662 Stiffness of left knee, not elsewhere classified: Secondary | ICD-10-CM | POA: Diagnosis not present

## 2022-02-15 DIAGNOSIS — Z96659 Presence of unspecified artificial knee joint: Secondary | ICD-10-CM | POA: Diagnosis not present

## 2022-02-17 DIAGNOSIS — Z96659 Presence of unspecified artificial knee joint: Secondary | ICD-10-CM | POA: Diagnosis not present

## 2022-02-17 DIAGNOSIS — M25662 Stiffness of left knee, not elsewhere classified: Secondary | ICD-10-CM | POA: Diagnosis not present

## 2022-03-02 DIAGNOSIS — Z96659 Presence of unspecified artificial knee joint: Secondary | ICD-10-CM | POA: Diagnosis not present

## 2022-03-02 DIAGNOSIS — Z96652 Presence of left artificial knee joint: Secondary | ICD-10-CM | POA: Diagnosis not present

## 2022-03-02 DIAGNOSIS — M25662 Stiffness of left knee, not elsewhere classified: Secondary | ICD-10-CM | POA: Diagnosis not present

## 2022-03-05 DIAGNOSIS — Z96659 Presence of unspecified artificial knee joint: Secondary | ICD-10-CM | POA: Diagnosis not present

## 2022-03-05 DIAGNOSIS — M25662 Stiffness of left knee, not elsewhere classified: Secondary | ICD-10-CM | POA: Diagnosis not present

## 2022-03-09 DIAGNOSIS — M25662 Stiffness of left knee, not elsewhere classified: Secondary | ICD-10-CM | POA: Diagnosis not present

## 2022-03-09 DIAGNOSIS — Z96659 Presence of unspecified artificial knee joint: Secondary | ICD-10-CM | POA: Diagnosis not present

## 2022-03-12 DIAGNOSIS — Z96659 Presence of unspecified artificial knee joint: Secondary | ICD-10-CM | POA: Diagnosis not present

## 2022-03-12 DIAGNOSIS — M25662 Stiffness of left knee, not elsewhere classified: Secondary | ICD-10-CM | POA: Diagnosis not present

## 2022-03-16 DIAGNOSIS — M25662 Stiffness of left knee, not elsewhere classified: Secondary | ICD-10-CM | POA: Diagnosis not present

## 2022-03-16 DIAGNOSIS — Z96659 Presence of unspecified artificial knee joint: Secondary | ICD-10-CM | POA: Diagnosis not present

## 2022-03-17 ENCOUNTER — Telehealth: Payer: Self-pay | Admitting: Family Medicine

## 2022-03-17 NOTE — Telephone Encounter (Signed)
Left message for patient to call back and schedule Medicare Annual Wellness Visit (AWV) either virtually or phone   awvi 04/27/18 per [facility]   This should be a 45 minute visit.  I left my direct # [phone]

## 2022-03-19 DIAGNOSIS — Z96659 Presence of unspecified artificial knee joint: Secondary | ICD-10-CM | POA: Diagnosis not present

## 2022-03-19 DIAGNOSIS — M25662 Stiffness of left knee, not elsewhere classified: Secondary | ICD-10-CM | POA: Diagnosis not present

## 2022-03-23 DIAGNOSIS — M25662 Stiffness of left knee, not elsewhere classified: Secondary | ICD-10-CM | POA: Diagnosis not present

## 2022-03-23 DIAGNOSIS — Z96659 Presence of unspecified artificial knee joint: Secondary | ICD-10-CM | POA: Diagnosis not present

## 2022-03-31 DIAGNOSIS — M25662 Stiffness of left knee, not elsewhere classified: Secondary | ICD-10-CM | POA: Diagnosis not present

## 2022-03-31 DIAGNOSIS — Z96659 Presence of unspecified artificial knee joint: Secondary | ICD-10-CM | POA: Diagnosis not present

## 2022-04-06 DIAGNOSIS — M25662 Stiffness of left knee, not elsewhere classified: Secondary | ICD-10-CM | POA: Diagnosis not present

## 2022-04-06 DIAGNOSIS — Z96659 Presence of unspecified artificial knee joint: Secondary | ICD-10-CM | POA: Diagnosis not present

## 2022-04-08 DIAGNOSIS — M25662 Stiffness of left knee, not elsewhere classified: Secondary | ICD-10-CM | POA: Diagnosis not present

## 2022-04-08 DIAGNOSIS — Z96659 Presence of unspecified artificial knee joint: Secondary | ICD-10-CM | POA: Diagnosis not present

## 2022-04-13 DIAGNOSIS — Z96659 Presence of unspecified artificial knee joint: Secondary | ICD-10-CM | POA: Diagnosis not present

## 2022-04-13 DIAGNOSIS — M25662 Stiffness of left knee, not elsewhere classified: Secondary | ICD-10-CM | POA: Diagnosis not present

## 2022-04-27 DIAGNOSIS — Z96659 Presence of unspecified artificial knee joint: Secondary | ICD-10-CM | POA: Diagnosis not present

## 2022-04-27 DIAGNOSIS — M25662 Stiffness of left knee, not elsewhere classified: Secondary | ICD-10-CM | POA: Diagnosis not present

## 2022-04-29 DIAGNOSIS — Z96652 Presence of left artificial knee joint: Secondary | ICD-10-CM | POA: Diagnosis not present

## 2022-04-29 DIAGNOSIS — R600 Localized edema: Secondary | ICD-10-CM | POA: Diagnosis not present

## 2022-05-04 DIAGNOSIS — Z96659 Presence of unspecified artificial knee joint: Secondary | ICD-10-CM | POA: Diagnosis not present

## 2022-05-04 DIAGNOSIS — M25662 Stiffness of left knee, not elsewhere classified: Secondary | ICD-10-CM | POA: Diagnosis not present

## 2022-05-24 DIAGNOSIS — Z96652 Presence of left artificial knee joint: Secondary | ICD-10-CM | POA: Diagnosis not present

## 2022-05-24 DIAGNOSIS — M1711 Unilateral primary osteoarthritis, right knee: Secondary | ICD-10-CM | POA: Diagnosis not present

## 2022-07-19 ENCOUNTER — Telehealth: Payer: Self-pay | Admitting: Family Medicine

## 2022-07-19 NOTE — Telephone Encounter (Signed)
Spoke to patient to  schedule Medicare Annual Wellness Visit (AWV) either virtually or phone.  She stated she was running last for appt and would like a call back 10/24 am   awvi 04/27/18 per [facility]  r   45 min for awv-i and in office appointments 30 min for awv-s  phone/virtual appointments

## 2022-07-20 NOTE — Telephone Encounter (Signed)
Tried calling patient to schedule.  Left voice mail asking to call [phone]

## 2022-08-11 ENCOUNTER — Ambulatory Visit: Payer: PPO

## 2022-08-11 ENCOUNTER — Telehealth: Payer: Self-pay

## 2022-08-11 NOTE — Telephone Encounter (Signed)
Unable to reach patient for scheduled AWV. No answer. Phone line goes straight to voicemail. Left message. Okay to reschedule.

## 2022-08-24 ENCOUNTER — Ambulatory Visit: Payer: PPO

## 2022-08-31 ENCOUNTER — Telehealth: Payer: Self-pay

## 2022-08-31 ENCOUNTER — Ambulatory Visit: Payer: PPO

## 2022-08-31 NOTE — Telephone Encounter (Signed)
No answer when called for scheduled AWV. Phone does not ring and goes straight to voicemail. Left message to call the office back. Okay to reschedule.

## 2022-10-15 ENCOUNTER — Ambulatory Visit (INDEPENDENT_AMBULATORY_CARE_PROVIDER_SITE_OTHER): Payer: PPO | Admitting: Family Medicine

## 2022-10-15 ENCOUNTER — Encounter: Payer: Self-pay | Admitting: Family Medicine

## 2022-10-15 VITALS — BP 124/80 | HR 56 | Temp 97.6°F | Ht 65.5 in | Wt 128.2 lb

## 2022-10-15 DIAGNOSIS — K219 Gastro-esophageal reflux disease without esophagitis: Secondary | ICD-10-CM | POA: Diagnosis not present

## 2022-10-15 DIAGNOSIS — J029 Acute pharyngitis, unspecified: Secondary | ICD-10-CM | POA: Diagnosis not present

## 2022-10-15 DIAGNOSIS — R1319 Other dysphagia: Secondary | ICD-10-CM | POA: Diagnosis not present

## 2022-10-15 DIAGNOSIS — R1310 Dysphagia, unspecified: Secondary | ICD-10-CM | POA: Insufficient documentation

## 2022-10-15 MED ORDER — OMEPRAZOLE 20 MG PO CPDR: 20.0000 mg | DELAYED_RELEASE_CAPSULE | Freq: Every day | ORAL | 0 refills | Status: DC

## 2022-10-15 MED ORDER — FAMOTIDINE 20 MG PO TABS: 20.0000 mg | ORAL_TABLET | Freq: Every day | ORAL | 0 refills | Status: DC

## 2022-10-15 NOTE — Patient Instructions (Addendum)
Watch your diet for triggers  Avoid carbonated drinks  Avoid eating/ drinking right before you lie down   Start omeprazole 20 mg daily  (take this in am with water at least 30 minutes before food or drink or medicine or vitamins   Start generic pepcid at bedtime 20 mg   Avoid ibuprofen, meloxicam, naproxen or aspirin - nsaids    Follow up in 2 weeks please   Take care of yourself

## 2022-10-15 NOTE — Assessment & Plan Note (Signed)
Suspect GERD rel Esophageal mostly  Some cough with eating occ    See A/P for GERD Omeprazole Pepcid Close f/u

## 2022-10-15 NOTE — Assessment & Plan Note (Addendum)
Pt notes : Cough and hoarseness off and on for over 10 y  Now - some sympt of esoph dysphagia, occ discomfort in throat and choking with eating , burping and feeling of food reflux at night  Some epigastric tenderness on exam Of note- inc stress/husb with cancer Will tx with  Omeprazole 20 mg daily in am  Pepcid 20 at bedtime Watch closely  F/u 2 wk  May need to consider GI ref and/or cxr Given handout re: food choices with GERD

## 2022-10-15 NOTE — Progress Notes (Signed)
Subjective:    Patient ID: Christie Lin, female    DOB: 08/27/1951, 72 y.o.   MRN: [id number]  HPI Pt presents for c/o sore throat and also indigestion  Some symptoms for 7-8 months   Wt Readings from Last 3 Encounters:  10/15/22 128 lb 4 oz (58.2 kg)  01/24/22 123 lb 7.3 oz (56 kg)  01/20/22 123 lb 8 oz (56 kg)   21.02 kg/m  Vitals:   10/15/22 1232  BP: 124/80  Pulse: (!) 56  Temp: 97.6 F (36.4 C)  SpO2: 100%   Husband was dx with lung cancer 2 weeks ago   Sometimes when she eats she chokes  Or gets a stinging sensation in throat/eyes and eyes water  In the past few weeks she feels like her food stops half way down Can hurt  Then drinks water and it goes down   In past few weeks a little indigestion (this is unusual for her) Burping  Last week felt like food in esoph came up to her throat   No recent heart burn   Throat gets a little sore and occ hoarse /voice gets scratchy    No nsaids right now  Last time was April - knee replacement /has not needed since  No longer takes baby asa daily     No fever No headache   Diet has not changed  More stress the past 2 weeks   Has had some cough/hoarseness for over 10 years  The other symptoms are new   2 cups of coffee in the am  Otherwise just water No particular triggers  Richer food in the holiday time   Patient Active Problem List   Diagnosis Date Noted   Sore throat 10/15/2022   GERD (gastroesophageal reflux disease) 10/15/2022   Dysphagia 10/15/2022   S/P TKR (total knee replacement) using cement, left 01/20/2022   Knee pain, bilateral 08/04/2021   Palpitations 02/02/2021   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   Skin lesion 12/17/2020   Puncture wound of great toe 03/10/2020   Urinary frequency 08/29/2019   Low back pain potentially associated with radiculopathy 06/20/2017   Productive cough 09/03/2016   Pulmonary nodules 11/21/2015   Colon cancer screening 11/21/2015   Abdominal pain  08/29/2015   Need for hepatitis C screening test 08/29/2015   Mid back pain 08/13/2013   Right knee pain 08/13/2013   Baker's cyst of knee 08/13/2013   Near syncope 10/12/2012   Syncope 09/25/2012   Hypotension 09/25/2012   Thoracic sprain 09/08/2012   VERTIGO 01/17/2009   Past Medical History:  Diagnosis Date   Anemia    Blood transfusion without reported diagnosis    DDD (degenerative disc disease), lumbar 06/20/2017   L3-4   Depression    grief reaction   Endometriosis    Fall    fell from broken sidewalk   Hypotension    per pt she has fainted from low b/p in the past   Low back pain    Palpitations 2022   no medications   Scoliosis    Past Surgical History:  Procedure Laterality Date   ABLATION ON ENDOMETRIOSIS     CESAREAN SECTION     COLONOSCOPY     ruptured ovarian cyst     TOTAL KNEE ARTHROPLASTY Left 01/20/2022   Procedure: TOTAL KNEE ARTHROPLASTY;  Surgeon: Lovell Sheehan, MD;  Location: [facility] ORS;  Service: Orthopedics;  Laterality: Left;   TUBAL LIGATION     WISDOM TOOTH EXTRACTION  Social History   Tobacco Use   Smoking status: Never   Smokeless tobacco: Never  Vaping Use   Vaping Use: Never used  Substance Use Topics   Alcohol use: Yes    Alcohol/week: 0.0 standard drinks of alcohol    Comment: rare   Drug use: No   Family History  Problem Relation Age of Onset   Cancer Maternal Grandmother        ovarian   Depression Sister    Colon cancer Neg Hx    Esophageal cancer Neg Hx    Pancreatic cancer Neg Hx    Rectal cancer Neg Hx    Stomach cancer Neg Hx    Allergies  Allergen Reactions   Hydrocodone-Acetaminophen Other (See Comments)    Drop BP   Penicillins Swelling and Rash    Throat swelling, rash   Current Outpatient Medications on File Prior to Visit  Medication Sig Dispense Refill   citalopram (CELEXA) 20 MG tablet TAKE 1 TABLET BY MOUTH EVERY OTHER DAY. 45 tablet 3   MELATONIN PO Take 1 tablet by mouth at bedtime as needed  (sleep).     midodrine (PROAMATINE) 10 MG tablet Take 1 tablet (10 mg total) by mouth 3 (three) times daily as needed. 90 tablet 2   No current facility-administered medications on file prior to visit.    Review of Systems  Constitutional:  Negative for activity change, appetite change, fatigue, fever and unexpected weight change.  HENT:  Positive for sore throat, trouble swallowing and voice change. Negative for congestion and rhinorrhea.   Eyes:  Negative for pain, redness, itching and visual disturbance.  Respiratory:  Negative for cough, chest tightness, shortness of breath and wheezing.   Cardiovascular:  Negative for chest pain and palpitations.  Gastrointestinal:  Negative for abdominal distention, abdominal pain, anal bleeding, blood in stool, constipation, diarrhea, nausea, rectal pain and vomiting.       Esophageal symptoms   Burping   Endocrine: Negative for cold intolerance, heat intolerance, polydipsia and polyuria.  Genitourinary:  Negative for difficulty urinating, dysuria, frequency and urgency.  Musculoskeletal:  Negative for arthralgias, joint swelling and myalgias.  Skin:  Negative for pallor and rash.  Neurological:  Negative for dizziness, tremors, weakness, numbness and headaches.  Hematological:  Negative for adenopathy. Does not bruise/bleed easily.  Psychiatric/Behavioral:  Negative for decreased concentration and dysphoric mood. The patient is not nervous/anxious.        Stressors       Objective:   Physical Exam Constitutional:      General: She is not in acute distress.    Appearance: Normal appearance. She is well-developed and normal weight. She is not ill-appearing.  HENT:     Head: Normocephalic and atraumatic.     Mouth/Throat:     Mouth: Mucous membranes are moist.     Pharynx: Oropharynx is clear. No oropharyngeal exudate or posterior oropharyngeal erythema.     Comments: Scant clear pnd Eyes:     General: No scleral icterus.     Conjunctiva/sclera: Conjunctivae normal.     Pupils: Pupils are equal, round, and reactive to light.  Cardiovascular:     Rate and Rhythm: Normal rate and regular rhythm.     Heart sounds: Normal heart sounds.  Pulmonary:     Effort: Pulmonary effort is normal. No respiratory distress.     Breath sounds: Normal breath sounds. No wheezing or rales.  Abdominal:     General: Bowel sounds are normal. There is no  distension.     Palpations: Abdomen is soft. There is no hepatomegaly, splenomegaly, mass or pulsatile mass.     Tenderness: There is abdominal tenderness in the epigastric area. There is no guarding or rebound. Negative signs include Murphy's sign and McBurney's sign.     Hernia: No hernia is present.  Musculoskeletal:     Cervical back: Normal range of motion and neck supple.  Lymphadenopathy:     Cervical: No cervical adenopathy.  Skin:    General: Skin is warm and dry.     Coloration: Skin is not pale.     Findings: No erythema.  Neurological:     Mental Status: She is alert.           Assessment & Plan:   Problem List Items Addressed This Visit       Digestive   Dysphagia    Suspect GERD rel Esophageal mostly  Some cough with eating occ    See A/P for GERD Omeprazole Pepcid Close f/u      GERD (gastroesophageal reflux disease) - Primary    Pt notes : Cough and hoarseness off and on for over 10 y  Now - some sympt of esoph dysphagia, occ discomfort in throat and choking with eating , burping and feeling of food reflux at night  Some epigastric tenderness on exam Of note- inc stress/husb with cancer Will tx with  Omeprazole 20 mg daily in am  Pepcid 20 at bedtime Watch closely  F/u 2 wk  May need to consider GI ref and/or cxr Given handout re: food choices with GERD      Relevant Medications   omeprazole (PRILOSEC) 20 MG capsule   famotidine (PEPCID) 20 MG tablet     Other   Sore throat   Relevant Orders   POCT rapid strep A

## 2022-11-01 ENCOUNTER — Ambulatory Visit (INDEPENDENT_AMBULATORY_CARE_PROVIDER_SITE_OTHER): Payer: PPO | Admitting: Family Medicine

## 2022-11-01 ENCOUNTER — Encounter: Payer: Self-pay | Admitting: Family Medicine

## 2022-11-01 VITALS — BP 122/64 | HR 73 | Temp 97.9°F | Ht 65.5 in | Wt 129.4 lb

## 2022-11-01 DIAGNOSIS — K219 Gastro-esophageal reflux disease without esophagitis: Secondary | ICD-10-CM | POA: Diagnosis not present

## 2022-11-01 DIAGNOSIS — R1319 Other dysphagia: Secondary | ICD-10-CM

## 2022-11-01 MED ORDER — OMEPRAZOLE 20 MG PO CPDR: 20.0000 mg | DELAYED_RELEASE_CAPSULE | Freq: Every day | ORAL | 3 refills | Status: DC

## 2022-11-01 MED ORDER — FAMOTIDINE 20 MG PO TABS: 20.0000 mg | ORAL_TABLET | Freq: Every day | ORAL | 3 refills | Status: DC

## 2022-11-01 NOTE — Assessment & Plan Note (Signed)
Pt has had some improvement in esoph dysphagia symptoms since starting ppi and H2 but not full resolution   Plan to continue omeprazole 20 mg daily and pepcid 20 mg at bedtime Watch diet for things that flare her  Chew food well Ref to GI for further eval/tx and poss EGD (? If poss stricture)

## 2022-11-01 NOTE — Patient Instructions (Signed)
Call the [facility] office for an appointment   [facility]  [phone]  If you have any problems let us know   Continue your current medicines for now If symptoms worsen in the meantime please let me know   Avoid any foods or drinks that worsen your symptoms

## 2022-11-01 NOTE — Progress Notes (Signed)
Subjective:    Patient ID: Christie Lin, female    DOB: 1951-02-29, 72 y.o.   MRN: [id number]  HPI Pt presents for 2 week follow up of GERD  Wt Readings from Last 3 Encounters:  11/01/22 129 lb 6 oz (58.7 kg)  10/15/22 128 lb 4 oz (58.2 kg)  01/24/22 123 lb 7.3 oz (56 kg)   21.20 kg/m  Vitals:   11/01/22 1029  BP: 122/64  Pulse: 73  Temp: 97.9 F (36.6 C)  SpO2: 99%    Last visit pt was seen for GERD symptoms with dysphagia and ST  Px omeprazole 20 mg in am Pepcid 20 mg at bedtime   More gas lately - ? 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   Skin lesion 12/17/2020   Puncture wound of great toe 03/10/2020   Urinary frequency 08/29/2019   Low back pain potentially associated with radiculopathy 06/20/2017   Productive cough 09/03/2016   Pulmonary nodules 11/21/2015   Colon cancer screening 11/21/2015   Abdominal pain 08/29/2015   Need for hepatitis C screening test 08/29/2015   Mid back pain 08/13/2013   Right knee pain 08/13/2013   Baker's cyst of knee 08/13/2013   Near syncope 10/12/2012   Syncope 09/25/2012   Hypotension 09/25/2012   Thoracic sprain 09/08/2012   VERTIGO 01/17/2009   Past Medical History:  Diagnosis Date   Anemia    Blood transfusion without reported diagnosis    DDD (degenerative disc disease), lumbar 06/20/2017   L3-4   Depression    grief  reaction   Endometriosis    Fall    fell from broken sidewalk   Hypotension    per pt she has fainted from low b/p in the past   Low back pain    Palpitations 2022   no medications   Scoliosis    Past Surgical History:  Procedure Laterality Date   ABLATION ON ENDOMETRIOSIS     CESAREAN SECTION     COLONOSCOPY     ruptured ovarian cyst     TOTAL KNEE ARTHROPLASTY Left 01/20/2022   Procedure: TOTAL KNEE ARTHROPLASTY;  Surgeon: Lovell Sheehan, MD;  Location: [facility] ORS;  Service: Orthopedics;  Laterality: Left;   TUBAL LIGATION     WISDOM TOOTH EXTRACTION     Social History   Tobacco Use   Smoking status: Never   Smokeless tobacco: Never  Vaping Use   Vaping Use: Never used  Substance Use Topics   Alcohol use: Yes    Alcohol/week: 0.0 standard drinks of alcohol    Comment: rare   Drug use: No   Family History  Problem Relation Age of Onset   Cancer Maternal Grandmother        ovarian   Depression Sister    Colon cancer Neg Hx    Esophageal  cancer Neg Hx    Pancreatic cancer Neg Hx    Rectal cancer Neg Hx    Stomach cancer Neg Hx    Allergies  Allergen Reactions   Hydrocodone-Acetaminophen Other (See Comments)    Drop BP   Penicillins Swelling and Rash    Throat swelling, rash   Current Outpatient Medications on File Prior to Visit  Medication Sig Dispense Refill   citalopram (CELEXA) 20 MG tablet TAKE 1 TABLET BY MOUTH EVERY OTHER DAY. 45 tablet 3   MELATONIN PO Take 1 tablet by mouth at bedtime as needed (sleep).     midodrine (PROAMATINE) 10 MG tablet Take 1 tablet (10 mg total) by mouth 3 (three) times daily as needed. 90 tablet 2   No current facility-administered medications on file prior to visit.    Review of Systems  Constitutional:  Negative for activity change, appetite change, fatigue, fever and unexpected weight change.  HENT:  Positive for sore throat and trouble swallowing. Negative for congestion, ear pain, rhinorrhea, sinus pressure and voice  change.        Frequent throat clearing   Eyes:  Negative for pain, redness and visual disturbance.  Respiratory:  Positive for choking. Negative for cough, shortness of breath and wheezing.   Cardiovascular:  Negative for chest pain and palpitations.  Gastrointestinal:  Negative for abdominal distention, abdominal pain, blood in stool, constipation, diarrhea, nausea and vomiting.  Endocrine: Negative for polydipsia and polyuria.  Genitourinary:  Negative for dysuria, frequency and urgency.  Musculoskeletal:  Negative for arthralgias, back pain and myalgias.  Skin:  Negative for pallor and rash.  Allergic/Immunologic: Negative for environmental allergies.  Neurological:  Negative for dizziness, syncope and headaches.  Hematological:  Negative for adenopathy. Does not bruise/bleed easily.  Psychiatric/Behavioral:  Negative for decreased concentration and dysphoric mood. The patient is not nervous/anxious.        Stress Husband has lung cancer and starting tx soon        Objective:   Physical Exam Constitutional:      General: She is not in acute distress.    Appearance: Normal appearance. She is well-developed and normal weight.  HENT:     Head: Normocephalic and atraumatic.  Eyes:     General: No scleral icterus.    Conjunctiva/sclera: Conjunctivae normal.     Pupils: Pupils are equal, round, and reactive to light.  Cardiovascular:     Rate and Rhythm: Normal rate and regular rhythm.     Heart sounds: Normal heart sounds.  Pulmonary:     Effort: Pulmonary effort is normal. No respiratory distress.     Breath sounds: Normal breath sounds. No wheezing or rales.  Abdominal:     General: Abdomen is flat. Bowel sounds are normal. There is no distension.     Palpations: Abdomen is soft. There is no fluid wave, hepatomegaly, splenomegaly, mass or pulsatile mass.     Tenderness: There is abdominal tenderness in the epigastric area. There is no right CVA tenderness, left CVA tenderness,  guarding or rebound. Negative signs include Murphy's sign and McBurney's sign.     Comments: Mild epigastric tenderness  Musculoskeletal:     Cervical back: Normal range of motion and neck supple.  Lymphadenopathy:     Cervical: No cervical adenopathy.  Skin:    General: Skin is warm and dry.     Coloration: Skin is not pale.     Findings: No erythema.  Neurological:     Mental Status: She is  alert.  Psychiatric:        Mood and Affect: Mood normal.           Assessment & Plan:   Problem List Items Addressed This Visit       Digestive   Dysphagia - Primary    Pt has had some improvement in esoph dysphagia symptoms since starting ppi and H2 but not full resolution   Plan to continue omeprazole 20 mg daily and pepcid 20 mg at bedtime Watch diet for things that flare her  Chew food well Ref to GI for further eval/tx and poss EGD (? If poss stricture)      Relevant Orders   Ambulatory referral to Gastroenterology   GERD (gastroesophageal reflux disease)    Some improvement with ppi and H2 blocker Still some throat clearing  One episode of cough/choking with eating Still some esoph dysphagia  Will continue omeprazole 20  Pepcid 20 mg at bedtime GI referral  Watch diet       Relevant Medications   famotidine (PEPCID) 20 MG tablet   omeprazole (PRILOSEC) 20 MG capsule   Other Relevant Orders   Ambulatory referral to Gastroenterology

## 2022-11-01 NOTE — Assessment & Plan Note (Signed)
Some improvement with ppi and H2 blocker Still some throat clearing  One episode of cough/choking with eating Still some esoph dysphagia  Will continue omeprazole 20  Pepcid 20 mg at bedtime GI referral  Watch diet

## 2022-11-10 ENCOUNTER — Ambulatory Visit (INDEPENDENT_AMBULATORY_CARE_PROVIDER_SITE_OTHER): Payer: PPO

## 2022-11-10 VITALS — Ht 66.0 in | Wt 129.0 lb

## 2022-11-10 DIAGNOSIS — Z1231 Encounter for screening mammogram for malignant neoplasm of breast: Secondary | ICD-10-CM

## 2022-11-10 DIAGNOSIS — Z0000 Encounter for general adult medical examination without abnormal findings: Secondary | ICD-10-CM

## 2022-11-10 NOTE — Progress Notes (Signed)
I connected with  Christie Lin on 11/10/22 by a audio enabled telemedicine application and verified that I am speaking with the correct person using two identifiers.  Patient Location: Home  Provider Location: Office/Clinic  I discussed the limitations of evaluation and management by telemedicine. The patient expressed understanding and agreed to proceed.  Subjective:   Christie Lin is a 72 y.o. female who presents for Medicare Annual (Subsequent) preventive examination.  Review of Systems     Cardiac Risk Factors include: advanced age (>17mn, >>24women)     Objective:    Today's Vitals   11/10/22 1325  Weight: 129 lb (58.5 kg)  Height: 5' 6"$  (1.676 m)   Body mass index is 20.82 kg/m.     11/10/2022    1:20 PM 01/24/2022    9:07 AM 01/20/2022    9:03 AM 01/14/2022    2:57 PM 06/14/2020    9:45 PM  Advanced Directives  Does Patient Have a Medical Advance Directive? No No No No No  Would patient like information on creating a medical advance directive? Yes (ED - Information included in AVS) No - Patient declined No - Patient declined No - Patient declined     Current Medications (verified) Outpatient Encounter Medications as of 11/10/2022  Medication Sig   citalopram (CELEXA) 20 MG tablet TAKE 1 TABLET BY MOUTH EVERY OTHER DAY.   famotidine (PEPCID) 20 MG tablet Take 1 tablet (20 mg total) by mouth at bedtime.   MELATONIN PO Take 1 tablet by mouth at bedtime as needed (sleep).   midodrine (PROAMATINE) 10 MG tablet Take 1 tablet (10 mg total) by mouth 3 (three) times daily as needed.   omeprazole (PRILOSEC) 20 MG capsule Take 1 capsule (20 mg total) by mouth daily.   No facility-administered encounter medications on file as of 11/10/2022.    Allergies (verified) Hydrocodone-acetaminophen and Penicillins   History: Past Medical History:  Diagnosis Date   Anemia    Blood transfusion without reported diagnosis    DDD (degenerative disc disease), lumbar  06/20/2017   L3-4   Depression    grief reaction   Endometriosis    Fall    fell from broken sidewalk   Hypotension    per pt she has fainted from low b/p in the past   Low back pain    Palpitations 2022   no medications   Scoliosis    Past Surgical History:  Procedure Laterality Date   ABLATION ON ENDOMETRIOSIS     CESAREAN SECTION     COLONOSCOPY     ruptured ovarian cyst     TOTAL KNEE ARTHROPLASTY Left 01/20/2022   Procedure: TOTAL KNEE ARTHROPLASTY;  Surgeon: BLovell Sheehan MD;  Location: [facility] ORS;  Service: Orthopedics;  Laterality: Left;   TUBAL LIGATION     WISDOM TOOTH EXTRACTION     Family History  Problem Relation Age of Onset   Cancer Maternal Grandmother        ovarian   Depression Sister    Colon cancer Neg Hx    Esophageal cancer Neg Hx    Pancreatic cancer Neg Hx    Rectal cancer Neg Hx    Stomach cancer Neg Hx    Social History   Socioeconomic History   Marital status: Married    Spouse name: WThayer Jew  Number of children: 3   Years of education: Not on file   Highest education level: Not on file  Occupational History   Occupation:  Teacher  Tobacco Use   Smoking status: Never   Smokeless tobacco: Never  Vaping Use   Vaping Use: Never used  Substance and Sexual Activity   Alcohol use: Yes    Alcohol/week: 0.0 standard drinks of alcohol    Comment: rare   Drug use: No   Sexual activity: Not on file  Other Topics Concern   Not on file  Social History Narrative   Not on file   Social Determinants of Health   Financial Resource Strain: Low Risk  (11/10/2022)   Overall Financial Resource Strain (CARDIA)    Difficulty of Paying Living Expenses: Not hard at all  Food Insecurity: No Food Insecurity (11/10/2022)   Hunger Vital Sign    Worried About Running Out of Food in the Last Year: Never true    Ran Out of Food in the Last Year: Never true  Transportation Needs: No Transportation Needs (11/10/2022)   PRAPARE - Radiographer, therapeutic (Medical): No    Lack of Transportation (Non-Medical): No  Physical Activity: Sufficiently Active (11/10/2022)   Exercise Vital Sign    Days of Exercise per Week: 4 days    Minutes of Exercise per Session: 60 min  Stress: No Stress Concern Present (11/10/2022)   [place]    Feeling of Stress : Not at all  Social Connections: Moderately Integrated (11/10/2022)   Social Connection and Isolation Panel [NHANES]    Frequency of Communication with Friends and Family: More than three times a week    Frequency of Social Gatherings with Friends and Family: More than three times a week    Attends Religious Services: More than 4 times per year    Active Member of [employer] or Organizations: No    Attends Music therapist: Never    Marital Status: Married    Tobacco Counseling Counseling given: Not Answered   Clinical Intake:  Pre-visit preparation completed: Yes  Pain : No/denies pain     Diabetes: No  How often do you need to have someone help you when you read instructions, pamphlets, or other written materials from your doctor or pharmacy?: 1 - Never  Diabetic?no  Interpreter Needed?: No  Information entered by :: B.Kasen Adduci,LPN   Activities of Daily Living    11/10/2022    1:21 PM 01/20/2022   12:56 PM  In your present state of health, do you have any difficulty performing the following activities:  Hearing? 0 0  Vision? 0 0  Difficulty concentrating or making decisions? 0 0  Walking or climbing stairs? 0 0  Dressing or bathing? 0 0  Doing errands, shopping? 0 0  Preparing Food and eating ? N   Using the Toilet? N   In the past six months, have you accidently leaked urine? Y   Do you have problems with loss of bowel control? N   Managing your Medications? N   Managing your Finances? N   Housekeeping or managing your Housekeeping? N     Patient Care Team: Tower, Wynelle Fanny, MD  as PCP - General  Indicate any recent Medical Services you may have received from other than [facility] providers in the past year (date may be approximate).     Assessment:   This is a routine wellness examination for Christie Lin.  Hearing/Vision screen Hearing Screening - Comments:: Adequate hearing Vision Screening - Comments:: Adequate with glasses. [facility] in [place]  Dietary issues and exercise activities  discussed: Current Exercise Habits: Home exercise routine, Type of exercise: walking, Time (Minutes): 45, Frequency (Times/Week): 4, Weekly Exercise (Minutes/Week): 180, Intensity: Mild, Exercise limited by: None identified   Goals Addressed   None    Depression Screen    11/10/2022    2:41 PM 11/01/2022   10:37 AM 08/04/2021   11:19 AM  PHQ 2/9 Scores  PHQ - 2 Score 0 0 0  PHQ- 9 Score 0 0 0    Fall Risk    11/10/2022    2:39 PM 11/01/2022   10:37 AM  [place] in the past year? 0 0  Number falls in past yr: 0 0  Injury with Fall? 0 0  Risk for fall due to : No Fall Risks No Fall Risks  Follow up Education provided;Falls prevention discussed Falls evaluation completed    FALL RISK PREVENTION PERTAINING TO THE HOME:  Any stairs in or around the home? Yes  If so, are there any without handrails? Yes  Home free of loose throw rugs in walkways, pet beds, electrical cords, etc? Yes  Adequate lighting in your home to reduce risk of falls? Yes   ASSISTIVE DEVICES UTILIZED TO PREVENT FALLS:  Life alert? No  Use of a cane, walker or w/c? No  Grab bars in the bathroom? No  Shower chair or bench in shower? No  Elevated toilet seat or a handicapped toilet? Yes   Cognitive Function:        11/10/2022    1:22 PM  6CIT Screen  What Year? 0 points  What month? 0 points  What time? 0 points  Count back from 20 0 points  Months in reverse 0 points  Repeat phrase 0 points  Total Score 0 points   Immunizations Immunization History  Administered Date(s) Administered    Hepatitis A, Adult 08/29/2015   Influenza,inj,Quad PF,6+ Mos 11/01/2013, 08/29/2015, 08/29/2019   PFIZER(Purple Top)SARS-COV-2 Vaccination 01/12/2021   Td 03/10/2020   Tdap 11/01/2013    TDAP status: Up to date  Flu Vaccine status: Up to date  Pneumococcal vaccine status: Declined,  Education has been provided regarding the importance of this vaccine but patient still declined. Advised may receive this vaccine at local pharmacy or Health Dept. Aware to provide a copy of the vaccination record if obtained from local pharmacy or Health Dept. Verbalized acceptance and understanding.   Covid-19 vaccine status: Completed vaccines  Qualifies for Shingles Vaccine? Yes  Pt declines Zostavax completed No   Shingrix Completed?: No.    Education has been provided regarding the importance of this vaccine. Patient has been advised to call insurance company to determine out of pocket expense if they have not yet received this vaccine. Advised may also receive vaccine at local pharmacy or Health Dept. Verbalized acceptance and understanding.  Screening Tests Health Maintenance  Topic Date Due   Zoster Vaccines- Shingrix (1 of 2) Never done   MAMMOGRAM  Never done   Pneumonia Vaccine 61+ Years old (1 of 1 - PCV) Never done   DEXA SCAN  05/20/2017   COLONOSCOPY (Pts 45-5yr Insurance coverage will need to be confirmed)  06/02/2021   COVID-19 Vaccine (2 - Pfizer risk series) 11/17/2022 (Originally 02/02/2021)   INFLUENZA VACCINE  12/26/2022 (Originally 04/27/2022)   Medicare Annual Wellness (AWV)  11/11/2023   DTaP/Tdap/Td (3 - Td or Tdap) 03/10/2030   Hepatitis C Screening  Completed   HPV VACCINES  Aged Out    Health Maintenance  Health  Maintenance Due  Topic Date Due   Zoster Vaccines- Shingrix (1 of 2) Never done   MAMMOGRAM  Never done   Pneumonia Vaccine 22+ Years old (1 of 1 - PCV) Never done   DEXA SCAN  05/20/2017   COLONOSCOPY (Pts 45-33yr Insurance coverage will need to be  confirmed)  06/02/2021    Colorectal cancer screening: Type of screening: Colonoscopy. Completed yes. Repeat every 5 years  Mammogram status: Ordered yes. Pt provided with contact info and advised to call to schedule appt.   Bone Density status: Completed yes. Results reflect: Bone density results: NORMAL. Repeat every 5 years.  Lung Cancer Screening: (Low Dose CT Chest recommended if Age 71-80years, 30 pack-year currently smoking OR have quit w/in 15years.) does not qualify.   Lung Cancer Screening Referral: no  Additional Screening:  Hepatitis C Screening: does not qualify; Completed no  Vision Screening: Recommended annual ophthalmology exams for early detection of glaucoma and other disorders of the eye. Is the patient up to date with their annual eye exam?  Yes  Who is the provider or what is the name of the office in which the patient attends annual eye exams? [facility] is not established with a provider, would they like to be referred to a provider to establish care? No .   Dental Screening: Recommended annual dental exams for proper oral hygiene  Community Resource Referral / Chronic Care Management: CRR required this visit?  No   CCM required this visit?  No      Plan:     I have personally reviewed and noted the following in the patient's chart:   Medical and social history Use of alcohol, tobacco or illicit drugs  Current medications and supplements including opioid prescriptions. Patient is not currently taking opioid prescriptions. Functional ability and status Nutritional status Physical activity Advanced directives List of other physicians Hospitalizations, surgeries, and ER visits in previous 12 months Vitals Screenings to include cognitive, depression, and falls Referrals and appointments  In addition, I have reviewed and discussed with patient certain preventive protocols, quality metrics, and best practice recommendations. A written  personalized care plan for preventive services as well as general preventive health recommendations were provided to patient.     [facility] LPN   [id number]  Nurse Notes: pt is doing well:has no concerns or questions.

## 2022-11-10 NOTE — Patient Instructions (Signed)
Christie Lin , Thank you for taking time to come for your Medicare Wellness Visit. I appreciate your ongoing commitment to your health goals. Please review the following plan we discussed and let me know if I can assist you in the future.   These are the goals we discussed:  Goals   None     This is a list of the screening recommended for you and due dates:  Health Maintenance  Topic Date Due   Zoster (Shingles) Vaccine (1 of 2) Never done   Mammogram  Never done   Pneumonia Vaccine (1 of 1 - PCV) Never done   DEXA scan (bone density measurement)  05/20/2017   Colon Cancer Screening  06/02/2021   COVID-19 Vaccine (2 - Pfizer risk series) 11/17/2022*   Flu Shot  12/26/2022*   Medicare Annual Wellness Visit  11/11/2023   DTaP/Tdap/Td vaccine (3 - Td or Tdap) 03/10/2030   Hepatitis C Screening: USPSTF Recommendation to screen - Ages 1-79 yo.  Completed   HPV Vaccine  Aged Out  *Topic was postponed. The date shown is not the original due date.    Advanced directives: no  Conditions/risks identified: none  Next appointment: Follow up in one year for your annual wellness visit 11/14/2023 @2pm$  telephone   Preventive Care 65 Years and Older, Female Preventive care refers to lifestyle choices and visits with your health care provider that can promote health and wellness. What does preventive care include? A yearly physical exam. This is also called an annual well check. Dental exams once or twice a year. Routine eye exams. Ask your health care provider how often you should have your eyes checked. Personal lifestyle choices, including: Daily care of your teeth and gums. Regular physical activity. Eating a healthy diet. Avoiding tobacco and drug use. Limiting alcohol use. Practicing safe sex. Taking low-dose aspirin every day. Taking vitamin and mineral supplements as recommended by your health care provider. What happens during an annual well check? The services and screenings  done by your health care provider during your annual well check will depend on your age, overall health, lifestyle risk factors, and family history of disease. Counseling  Your health care provider may ask you questions about your: Alcohol use. Tobacco use. Drug use. Emotional well-being. Home and relationship well-being. Sexual activity. Eating habits. History of falls. Memory and ability to understand (cognition). Work and work Statistician. Reproductive health. Screening  You may have the following tests or measurements: Height, weight, and BMI. Blood pressure. Lipid and cholesterol levels. These may be checked every 5 years, or more frequently if you are over 109 years old. Skin check. Lung cancer screening. You may have this screening every year starting at age 49 if you have a 30-pack-year history of smoking and currently smoke or have quit within the past 15 years. Fecal occult blood test (FOBT) of the stool. You may have this test every year starting at age 20. Flexible sigmoidoscopy or colonoscopy. You may have a sigmoidoscopy every 5 years or a colonoscopy every 10 years starting at age 83. Hepatitis C blood test. Hepatitis B blood test. Sexually transmitted disease (STD) testing. Diabetes screening. This is done by checking your blood sugar (glucose) after you have not eaten for a while (fasting). You may have this done every 1-3 years. Bone density scan. This is done to screen for osteoporosis. You may have this done starting at age 74. Mammogram. This may be done every 1-2 years. Talk to your health care provider  about how often you should have regular mammograms. Talk with your health care provider about your test results, treatment options, and if necessary, the need for more tests. Vaccines  Your health care provider may recommend certain vaccines, such as: Influenza vaccine. This is recommended every year. Tetanus, diphtheria, and acellular pertussis (Tdap, Td)  vaccine. You may need a Td booster every 10 years. Zoster vaccine. You may need this after age 29. Pneumococcal 13-valent conjugate (PCV13) vaccine. One dose is recommended after age 55. Pneumococcal polysaccharide (PPSV23) vaccine. One dose is recommended after age 70. Talk to your health care provider about which screenings and vaccines you need and how often you need them. This information is not intended to replace advice given to you by your health care provider. Make sure you discuss any questions you have with your health care provider. Document Released: 10/10/2015 Document Revised: 06/02/2016 Document Reviewed: 07/15/2015 Elsevier Interactive Patient Education  2017 [place] Prevention in the Home Falls can cause injuries. They can happen to people of all ages. There are many things you can do to make your home safe and to help prevent falls. What can I do on the outside of my home? Regularly fix the edges of walkways and driveways and fix any cracks. Remove anything that might make you trip as you walk through a door, such as a raised step or threshold. Trim any bushes or trees on the path to your home. Use bright outdoor lighting. Clear any walking paths of anything that might make someone trip, such as rocks or tools. Regularly check to see if handrails are loose or broken. Make sure that both sides of any steps have handrails. Any raised decks and porches should have guardrails on the edges. Have any leaves, snow, or ice cleared regularly. Use sand or salt on walking paths during winter. Clean up any spills in your garage right away. This includes oil or grease spills. What can I do in the bathroom? Use night lights. Install grab bars by the toilet and in the tub and shower. Do not use towel bars as grab bars. Use non-skid mats or decals in the tub or shower. If you need to sit down in the shower, use a plastic, non-slip stool. Keep the floor dry. Clean up any  water that spills on the floor as soon as it happens. Remove soap buildup in the tub or shower regularly. Attach bath mats securely with double-sided non-slip rug tape. Do not have throw rugs and other things on the floor that can make you trip. What can I do in the bedroom? Use night lights. Make sure that you have a light by your bed that is easy to reach. Do not use any sheets or blankets that are too big for your bed. They should not hang down onto the floor. Have a firm chair that has side arms. You can use this for support while you get dressed. Do not have throw rugs and other things on the floor that can make you trip. What can I do in the kitchen? Clean up any spills right away. Avoid walking on wet floors. Keep items that you use a lot in easy-to-reach places. If you need to reach something above you, use a strong step stool that has a grab bar. Keep electrical cords out of the way. Do not use floor polish or wax that makes floors slippery. If you must use wax, use non-skid floor wax. Do not have throw rugs and  other things on the floor that can make you trip. What can I do with my stairs? Do not leave any items on the stairs. Make sure that there are handrails on both sides of the stairs and use them. Fix handrails that are broken or loose. Make sure that handrails are as long as the stairways. Check any carpeting to make sure that it is firmly attached to the stairs. Fix any carpet that is loose or worn. Avoid having throw rugs at the top or bottom of the stairs. If you do have throw rugs, attach them to the floor with carpet tape. Make sure that you have a light switch at the top of the stairs and the bottom of the stairs. If you do not have them, ask someone to add them for you. What else can I do to help prevent falls? Wear shoes that: Do not have high heels. Have rubber bottoms. Are comfortable and fit you well. Are closed at the toe. Do not wear sandals. If you use a  stepladder: Make sure that it is fully opened. Do not climb a closed stepladder. Make sure that both sides of the stepladder are locked into place. Ask someone to hold it for you, if possible. Clearly mark and make sure that you can see: Any grab bars or handrails. First and last steps. Where the edge of each step is. Use tools that help you move around (mobility aids) if they are needed. These include: Canes. Walkers. Scooters. Crutches. Turn on the lights when you go into a dark area. Replace any light bulbs as soon as they burn out. Set up your furniture so you have a clear path. Avoid moving your furniture around. If any of your floors are uneven, fix them. If there are any pets around you, be aware of where they are. Review your medicines with your doctor. Some medicines can make you feel dizzy. This can increase your chance of falling. Ask your doctor what other things that you can do to help prevent falls. This information is not intended to replace advice given to you by your health care provider. Make sure you discuss any questions you have with your health care provider. Document Released: 07/10/2009 Document Revised: 02/19/2016 Document Reviewed: 10/18/2014 Elsevier Interactive Patient Education  2017 [employer].

## 2022-11-23 ENCOUNTER — Telehealth: Payer: Self-pay | Admitting: Family Medicine

## 2022-11-23 DIAGNOSIS — N390 Urinary tract infection, site not specified: Secondary | ICD-10-CM | POA: Diagnosis not present

## 2022-11-23 DIAGNOSIS — R300 Dysuria: Secondary | ICD-10-CM | POA: Diagnosis not present

## 2022-11-23 NOTE — Telephone Encounter (Signed)
Needs a visit - cannot treat without one

## 2022-11-23 NOTE — Telephone Encounter (Signed)
LVM for patient to call back and schedule

## 2022-11-23 NOTE — Telephone Encounter (Signed)
Patient called and said she believes she has a UTI and wanted to know if something can be called in, I offered an appt for a different office today or one for here tomorrow since no opening here today. She declined and asked for a call back from nurse at [phone]

## 2022-12-17 ENCOUNTER — Other Ambulatory Visit: Payer: Self-pay | Admitting: Family Medicine

## 2022-12-17 DIAGNOSIS — Z1231 Encounter for screening mammogram for malignant neoplasm of breast: Secondary | ICD-10-CM

## 2023-01-26 ENCOUNTER — Encounter: Payer: Self-pay | Admitting: Gastroenterology

## 2023-01-26 ENCOUNTER — Telehealth (INDEPENDENT_AMBULATORY_CARE_PROVIDER_SITE_OTHER): Payer: PPO | Admitting: Gastroenterology

## 2023-01-26 VITALS — Ht 66.0 in | Wt 130.0 lb

## 2023-01-26 DIAGNOSIS — R1319 Other dysphagia: Secondary | ICD-10-CM | POA: Diagnosis not present

## 2023-01-26 DIAGNOSIS — K219 Gastro-esophageal reflux disease without esophagitis: Secondary | ICD-10-CM

## 2023-01-26 DIAGNOSIS — Z86010 Personal history of colonic polyps: Secondary | ICD-10-CM

## 2023-01-26 MED ORDER — PANTOPRAZOLE SODIUM 40 MG PO TBEC: 40.0000 mg | DELAYED_RELEASE_TABLET | Freq: Two times a day (BID) | ORAL | 5 refills | Status: DC

## 2023-01-26 NOTE — Addendum Note (Signed)
Addended by: Roena Malady on: 01/26/2023 09:19 AM   Modules accepted: Orders

## 2023-01-26 NOTE — Progress Notes (Signed)
Midge Minium, MD [address]  [address]  [place], [place] [address]  Main: [phone]  Fax: [phone]    Gastroenterology Virtual/Video Visit  Referring Provider:     Judy Pimple, MD Primary Care Physician:  Tower, Audrie Gallus, MD Primary Gastroenterologist:  Dr.Judianne Seiple Servando Snare Reason for Consultation:     Dysphagia        HPI:    Virtual Visit via Video Note Location of the patient: Home Location of provider: Home Office Participating persons: The patient and myself.  I connected with Christie Lin on 01/26/23 at  9:00 AM EDT by a video enabled telemedicine application and verified that I am speaking with the correct person using two identifiers.   I discussed the limitations of evaluation and management by telemedicine and the availability of in person appointments. The patient expressed understanding and agreed to proceed.  Verbal consent to proceed obtained.  History of Present Illness: Christie Lin is a 72 y.o. female referred by Dr. Milinda Antis, Audrie Gallus, MD  for consultation & management of dysphagia.  The patient had been seen by her primary care provider in January and then again in February for the feeling of food getting stuck at her choking on food.  The patient also has a history of colon polyps at her last colonoscopy was by Dr. Russella Dar in 2019.  At that time the patient was recommended to have a repeat colonoscopy in 5 years.  The patient was reported to be on omeprazole 20 mg a day and Pepcid 20 mg in the evening.  The patient had reported a lot of throat clearing and an episode of coughing and choking when she ate.  She was recommended to continue her present medications by her PCP and told to chew her food well and to follow-up with GI. The patient reports that her cough has gotten better but is still present on the medication but she does not seem to have seen any difference in her voice changes.  He also reports that her dysphagia is sometimes to bulky foods  but can happen with spicy foods also  Past Medical History:  Diagnosis Date   Anemia    Blood transfusion without reported diagnosis    DDD (degenerative disc disease), lumbar 06/20/2017   L3-4   Depression    grief reaction   Endometriosis    Fall    fell from broken sidewalk   Hypotension    per pt she has fainted from low b/p in the past   Low back pain    Palpitations 2022   no medications   Scoliosis     Past Surgical History:  Procedure Laterality Date   ABLATION ON ENDOMETRIOSIS     CESAREAN SECTION     COLONOSCOPY     ruptured ovarian cyst     TOTAL KNEE ARTHROPLASTY Left 01/20/2022   Procedure: TOTAL KNEE ARTHROPLASTY;  Surgeon: Lyndle Herrlich, MD;  Location: [facility] ORS;  Service: Orthopedics;  Laterality: Left;   TUBAL LIGATION     WISDOM TOOTH EXTRACTION      Prior to Admission medications   Medication Sig Start Date End Date Taking? Authorizing Provider  citalopram (CELEXA) 20 MG tablet TAKE 1 TABLET BY MOUTH EVERY OTHER DAY. 02/09/22   Tower, Audrie Gallus, MD  famotidine (PEPCID) 20 MG tablet Take 1 tablet (20 mg total) by mouth at bedtime. 11/01/22   Tower, Audrie Gallus, MD  MELATONIN PO Take 1 tablet by mouth at  bedtime as needed (sleep).    [provider]  midodrine (PROAMATINE) 10 MG tablet Take 1 tablet (10 mg total) by mouth 3 (three) times daily as needed. 03/17/21   Merwyn Katos, MD  omeprazole (PRILOSEC) 20 MG capsule Take 1 capsule (20 mg total) by mouth daily. 11/01/22   Tower, Audrie Gallus, MD    Family History  Problem Relation Age of Onset   Cancer Maternal Grandmother        ovarian   Depression Sister    Colon cancer Neg Hx    Esophageal cancer Neg Hx    Pancreatic cancer Neg Hx    Rectal cancer Neg Hx    Stomach cancer Neg Hx      Social History   Tobacco Use   Smoking status: Never   Smokeless tobacco: Never  Vaping Use   Vaping Use: Never used  Substance Use Topics   Alcohol use: Yes    Alcohol/week: 0.0 standard drinks of alcohol     Comment: rare   Drug use: No    Allergies as of 01/26/2023 - Review Complete 11/10/2022  Allergen Reaction Noted   Hydrocodone-acetaminophen Other (See Comments) 01/20/2022   Penicillins Swelling and Rash     Review of Systems:    All systems reviewed and negative except where noted in HPI.   Observations/Objective:  Labs: CBC    Component Value Date/Time   WBC 4.4 01/14/2022 1525   RBC 4.28 01/14/2022 1525   HGB 13.1 01/14/2022 1525   HCT 39.1 01/14/2022 1525   PLT 173 01/14/2022 1525   MCV 91.4 01/14/2022 1525   MCH 30.6 01/14/2022 1525   MCHC 33.5 01/14/2022 1525   RDW 13.1 01/14/2022 1525   LYMPHSABS 1.1 02/02/2021 0831   MONOABS 0.3 02/02/2021 0831   EOSABS 0.1 02/02/2021 0831   BASOSABS 0.0 02/02/2021 0831   CMP     Component Value Date/Time   NA 138 01/14/2022 1525   K 3.8 01/14/2022 1525   CL 106 01/14/2022 1525   CO2 29 01/14/2022 1525   GLUCOSE 96 01/14/2022 1525   BUN 14 01/14/2022 1525   CREATININE 0.80 01/14/2022 1525   CALCIUM 9.2 01/14/2022 1525   PROT 6.8 06/14/2020 2047   ALBUMIN 3.9 06/14/2020 2047   AST 21 06/14/2020 2047   ALT 10 06/14/2020 2047   ALKPHOS 41 06/14/2020 2047   BILITOT 0.7 06/14/2020 2047   GFRNONAA >60 01/14/2022 1525   GFRAA >60 06/14/2020 2047    Imaging Studies: No results found.  Assessment and Plan:   Christie Lin is a 72 y.o. y/o female has been referred for dysphagia, voice changes and a cough.  The patient was presumed to have silent reflux.  The patient will stop the Pepcid and omeprazole and she will start on Protonix twice a day at 40 mg.  This should resolve her symptoms or improve them if they are caused by acid reflux.  The patient will also be set up for an EGD due to her report of dysphagia and colonoscopy because of her history of colon polyps.  The patient has been explained the plan and agrees with it.  Follow Up Instructions:  I discussed the assessment and treatment plan with the patient.  The patient was provided an opportunity to ask questions and all were answered. The patient agreed with the plan and demonstrated an understanding of the instructions.   The patient was advised to call back or seek an in-person evaluation if the  symptoms worsen or if the condition fails to improve as anticipated.  I provided 30 minutes of non-face-to-face time during this encounter including chart review In preparation for the encounter.   Midge Minium, MD  Speech recognition software was used to dictate the above note.

## 2023-01-28 MED ORDER — NA SULFATE-K SULFATE-MG SULF 17.5-3.13-1.6 GM/177ML PO SOLN: 1.0000 | Freq: Once | ORAL | 0 refills | Status: AC

## 2023-01-28 NOTE — Addendum Note (Signed)
Addended by: Roena Malady on: 01/28/2023 12:10 PM   Modules accepted: Orders

## 2023-03-08 ENCOUNTER — Ambulatory Visit: Payer: PPO | Admitting: Gastroenterology

## 2023-03-08 ENCOUNTER — Encounter: Payer: Self-pay | Admitting: Gastroenterology

## 2023-03-09 ENCOUNTER — Encounter: Payer: Self-pay | Admitting: Family Medicine

## 2023-03-09 ENCOUNTER — Ambulatory Visit (INDEPENDENT_AMBULATORY_CARE_PROVIDER_SITE_OTHER): Payer: PPO | Admitting: Family Medicine

## 2023-03-09 VITALS — BP 118/70 | HR 70 | Temp 97.8°F | Ht 66.0 in | Wt 127.5 lb

## 2023-03-09 DIAGNOSIS — N3946 Mixed incontinence: Secondary | ICD-10-CM

## 2023-03-09 DIAGNOSIS — R350 Frequency of micturition: Secondary | ICD-10-CM

## 2023-03-09 DIAGNOSIS — N3281 Overactive bladder: Secondary | ICD-10-CM | POA: Diagnosis not present

## 2023-03-09 LAB — POC URINALSYSI DIPSTICK (AUTOMATED)
Blood, UA: NEGATIVE
Glucose, UA: NEGATIVE
Ketones, UA: NEGATIVE
Leukocytes, UA: NEGATIVE
Nitrite, UA: NEGATIVE
Protein, UA: NEGATIVE
Spec Grav, UA: 1.01 (ref 1.010–1.025)
Urobilinogen, UA: 0.2 E.U./dL
pH, UA: 8 (ref 5.0–8.0)

## 2023-03-09 MED ORDER — SOLIFENACIN SUCCINATE 5 MG PO TABS
5.0000 mg | ORAL_TABLET | Freq: Every day | ORAL | 0 refills | Status: DC
Start: 2023-03-09 — End: 2023-06-06

## 2023-03-09 NOTE — Assessment & Plan Note (Signed)
Trial of vesicare low dose  Discussed possible side effects Ua clear Ref to urology

## 2023-03-09 NOTE — Patient Instructions (Addendum)
Try the generic vesicare once daily and see if this helps   I put the referral in for urology for further eval and treatment  Please let us know if you don't hear in 1-2 weeks   If symptoms worsen or change in the meantime let us know

## 2023-03-09 NOTE — Progress Notes (Signed)
Subjective:    Patient ID: Christie Lin, female    DOB: 15-Aug-1951, 72 y.o.   MRN: [id number]  HPI Pt presents for urinary symptoms    Wt Readings from Last 3 Encounters:  03/09/23 127 lb 8 oz (57.8 kg)  01/26/23 130 lb (59 kg)  11/10/22 129 lb (58.5 kg)   20.58 kg/m  Vitals:   03/09/23 1128  BP: 118/70  Pulse: 70  Temp: 97.8 F (36.6 C)  SpO2: 99%   Urinary problems building for a while   Frequency - all day long (already 11 times today)  Urgency   No burining / pain or pressure   Incontinence - when she stands up from sitting or lying and leak  Sometimes a gush  If she strains she leaks   Does not thinks she empties all the way   Had a bladder tack surgery with last birth / 34 years ago   Past history- her babies were all 9-10 lg    Ua is clear today Results for orders placed or performed in visit on 03/09/23  POCT Urinalysis Dipstick (Automated)  Result Value Ref Range   Color, UA Light Yellow    Clarity, UA Clear    Glucose, UA Negative Negative   Bilirubin, UA Neative    Ketones, UA Negative    Spec Grav, UA 1.010 1.010 - 1.025   Blood, UA Negative    pH, UA 8.0 5.0 - 8.0   Protein, UA Negative Negative   Urobilinogen, UA 0.2 0.2 or 1.0 E.U./dL   Nitrite, UA Negative    Leukocytes, UA Negative Negative    Last GFR 71 Last glucose 96   No vaginal itching or burning      Patient Active Problem List   Diagnosis Date Noted   Overactive bladder 03/09/2023   Mixed incontinence 03/09/2023   Sore throat 10/15/2022   GERD (gastroesophageal reflux disease) 10/15/2022   Dysphagia 10/15/2022   S/P TKR (total knee replacement) using cement, left 01/20/2022   Knee pain, bilateral 08/04/2021   Palpitations 02/02/2021   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   Skin lesion 12/17/2020   Puncture wound of great toe 03/10/2020   Urinary frequency 08/29/2019   Low back pain potentially associated with radiculopathy 06/20/2017   Productive cough  09/03/2016   Pulmonary nodules 11/21/2015   Colon cancer screening 11/21/2015   Abdominal pain 08/29/2015   Need for hepatitis C screening test 08/29/2015   Mid back pain 08/13/2013   Right knee pain 08/13/2013   Baker's cyst of knee 08/13/2013   Near syncope 10/12/2012   Syncope 09/25/2012   Hypotension 09/25/2012   Thoracic sprain 09/08/2012   VERTIGO 01/17/2009   Past Medical History:  Diagnosis Date   Anemia    Blood transfusion without reported diagnosis    DDD (degenerative disc disease), lumbar 06/20/2017   L3-4   Depression    grief reaction   Endometriosis    Fall    fell from broken sidewalk   Hypotension    per pt she has fainted from low b/p in the past   Low back pain    Palpitations 2022   no medications   Scoliosis    Past Surgical History:  Procedure Laterality Date   ABLATION ON ENDOMETRIOSIS     CESAREAN SECTION     COLONOSCOPY     ruptured ovarian cyst     TOTAL KNEE ARTHROPLASTY Left 01/20/2022   Procedure: TOTAL KNEE ARTHROPLASTY;  Surgeon: Lyndle Herrlich,  MD;  Location: [facility] ORS;  Service: Orthopedics;  Laterality: Left;   TUBAL LIGATION     WISDOM TOOTH EXTRACTION     Social History   Tobacco Use   Smoking status: Never   Smokeless tobacco: Never  Vaping Use   Vaping Use: Never used  Substance Use Topics   Alcohol use: Yes    Alcohol/week: 0.0 standard drinks of alcohol    Comment: rare   Drug use: No   Family History  Problem Relation Age of Onset   Cancer Maternal Grandmother        ovarian   Depression Sister    Colon cancer Neg Hx    Esophageal cancer Neg Hx    Pancreatic cancer Neg Hx    Rectal cancer Neg Hx    Stomach cancer Neg Hx    Allergies  Allergen Reactions   Hydrocodone-Acetaminophen Other (See Comments)    Drop BP   Penicillins Swelling and Rash    Throat swelling, rash   Current Outpatient Medications on File Prior to Visit  Medication Sig Dispense Refill   citalopram (CELEXA) 20 MG tablet TAKE 1 TABLET  BY MOUTH EVERY OTHER DAY. 45 tablet 3   MELATONIN PO Take 1 tablet by mouth at bedtime as needed (sleep).     midodrine (PROAMATINE) 10 MG tablet Take 1 tablet (10 mg total) by mouth 3 (three) times daily as needed. 90 tablet 2   pantoprazole (PROTONIX) 40 MG tablet Take 1 tablet (40 mg total) by mouth 2 (two) times daily before a meal. 60 tablet 5   No current facility-administered medications on file prior to visit.    Review of Systems  Constitutional:  Negative for activity change, appetite change, fatigue, fever and unexpected weight change.  HENT:  Negative for congestion, ear pain, rhinorrhea, sinus pressure and sore throat.   Eyes:  Negative for pain, redness and visual disturbance.  Respiratory:  Negative for cough, shortness of breath and wheezing.   Cardiovascular:  Negative for chest pain and palpitations.  Gastrointestinal:  Negative for abdominal pain, blood in stool, constipation and diarrhea.  Endocrine: Negative for polydipsia and polyuria.  Genitourinary:  Positive for frequency and urgency. Negative for decreased urine volume, dysuria, hematuria, vaginal bleeding, vaginal discharge and vaginal pain.  Musculoskeletal:  Negative for arthralgias, back pain and myalgias.  Skin:  Negative for pallor and rash.  Allergic/Immunologic: Negative for environmental allergies.  Neurological:  Negative for dizziness, syncope and headaches.  Hematological:  Negative for adenopathy. Does not bruise/bleed easily.  Psychiatric/Behavioral:  Negative for decreased concentration and dysphoric mood. The patient is not nervous/anxious.        Objective:   Physical Exam Constitutional:      General: She is not in acute distress.    Appearance: Normal appearance. She is well-developed and normal weight. She is not ill-appearing or diaphoretic.  HENT:     Head: Normocephalic and atraumatic.  Eyes:     Conjunctiva/sclera: Conjunctivae normal.     Pupils: Pupils are equal, round, and  reactive to light.  Neck:     Thyroid: No thyromegaly.     Vascular: No carotid bruit or JVD.  Cardiovascular:     Rate and Rhythm: Normal rate and regular rhythm.     Heart sounds: Normal heart sounds.     No gallop.  Pulmonary:     Effort: Pulmonary effort is normal. No respiratory distress.     Breath sounds: Normal breath sounds. No wheezing or rales.  Abdominal:     General: There is no distension or abdominal bruit.     Palpations: Abdomen is soft. There is no mass.     Tenderness: There is no abdominal tenderness. There is no right CVA tenderness or left CVA tenderness.     Comments: No suprapubic tenderness or fullness    Musculoskeletal:     Cervical back: Normal range of motion and neck supple.     Right lower leg: No edema.     Left lower leg: No edema.  Lymphadenopathy:     Cervical: No cervical adenopathy.  Skin:    General: Skin is warm and dry.     Coloration: Skin is not pale.     Findings: No rash.  Neurological:     Mental Status: She is alert.     Coordination: Coordination normal.     Deep Tendon Reflexes: Reflexes are normal and symmetric. Reflexes normal.  Psychiatric:        Mood and Affect: Mood normal.           Assessment & Plan:   Problem List Items Addressed This Visit       Genitourinary   Overactive bladder    Trial of vesicare low dose  Discussed possible side effects Ua clear Ref to urology      Relevant Orders   Ambulatory referral to Urology     Other   Urinary frequency - Primary    Pt has features of overactive bladder and mixed incontinence  Ua clear  Past h/o bladder tack over 66 y ago   Discussed trial of vesicare 5 mg for urgency Discussed possible side effects  Ref to urology for further eval/therapeutic       Relevant Orders   POCT Urinalysis Dipstick (Automated) (Completed)   Ambulatory referral to Urology   Mixed incontinence    With overactive bladder  Much worse lately Leaks esp when standing  Had  bladder tack many years ago   Trial of low dose vesicare Ref to [facility]

## 2023-03-09 NOTE — Assessment & Plan Note (Addendum)
With overactive bladder  Much worse lately Leaks esp when standing  Had bladder tack many years ago   Trial of low dose vesicare Ref to [facility]

## 2023-03-09 NOTE — Assessment & Plan Note (Signed)
Pt has features of overactive bladder and mixed incontinence  Ua clear  Past h/o bladder tack over 71 y ago   Discussed trial of vesicare 5 mg for urgency Discussed possible side effects  Ref to urology for further eval/therapeutic

## 2023-03-15 ENCOUNTER — Ambulatory Visit: Payer: PPO | Admitting: Anesthesiology

## 2023-03-15 ENCOUNTER — Encounter: Payer: Self-pay | Admitting: Gastroenterology

## 2023-03-15 ENCOUNTER — Encounter
Admission: RE | Disposition: A | Discharge status: Home / Self Care | Payer: Self-pay | Source: Home / Self Care | Attending: Gastroenterology

## 2023-03-15 ENCOUNTER — Ambulatory Visit
Admission: RE | Admit: 2023-03-15 | Discharge: 2023-03-15 | Disposition: A | Discharge status: Home / Self Care | Payer: PPO | Attending: Gastroenterology | Admitting: Gastroenterology

## 2023-03-15 DIAGNOSIS — K449 Diaphragmatic hernia without obstruction or gangrene: Secondary | ICD-10-CM | POA: Insufficient documentation

## 2023-03-15 DIAGNOSIS — K5730 Diverticulosis of large intestine without perforation or abscess without bleeding: Secondary | ICD-10-CM | POA: Insufficient documentation

## 2023-03-15 DIAGNOSIS — K219 Gastro-esophageal reflux disease without esophagitis: Secondary | ICD-10-CM

## 2023-03-15 DIAGNOSIS — K635 Polyp of colon: Secondary | ICD-10-CM | POA: Diagnosis not present

## 2023-03-15 DIAGNOSIS — K5790 Diverticulosis of intestine, part unspecified, without perforation or abscess without bleeding: Secondary | ICD-10-CM | POA: Diagnosis not present

## 2023-03-15 DIAGNOSIS — Z86010 Personal history of colonic polyps: Secondary | ICD-10-CM

## 2023-03-15 DIAGNOSIS — D122 Benign neoplasm of ascending colon: Secondary | ICD-10-CM | POA: Diagnosis not present

## 2023-03-15 DIAGNOSIS — K641 Second degree hemorrhoids: Secondary | ICD-10-CM | POA: Insufficient documentation

## 2023-03-15 DIAGNOSIS — R1319 Other dysphagia: Secondary | ICD-10-CM | POA: Diagnosis not present

## 2023-03-15 DIAGNOSIS — Z1211 Encounter for screening for malignant neoplasm of colon: Secondary | ICD-10-CM | POA: Diagnosis not present

## 2023-03-15 DIAGNOSIS — R1310 Dysphagia, unspecified: Secondary | ICD-10-CM | POA: Insufficient documentation

## 2023-03-15 HISTORY — PX: COLONOSCOPY WITH PROPOFOL: SHX5780

## 2023-03-15 HISTORY — PX: POLYPECTOMY: SHX5525

## 2023-03-15 HISTORY — PX: BALLOON DILATION: SHX5330

## 2023-03-15 HISTORY — PX: ESOPHAGOGASTRODUODENOSCOPY (EGD) WITH PROPOFOL: SHX5813

## 2023-03-15 SURGERY — COLONOSCOPY WITH PROPOFOL
Anesthesia: General

## 2023-03-15 MED ORDER — LIDOCAINE HCL (CARDIAC) PF 100 MG/5ML IV SOSY
PREFILLED_SYRINGE | INTRAVENOUS | Status: DC | PRN
  Administered 2023-03-15: 40 mg via INTRAVENOUS

## 2023-03-15 MED ORDER — SODIUM CHLORIDE 0.9 % IV SOLN: INTRAVENOUS | Status: DC

## 2023-03-15 MED ORDER — GLYCOPYRROLATE 0.2 MG/ML IJ SOLN
INTRAMUSCULAR | Status: DC | PRN
  Administered 2023-03-15: .1 mg via INTRAVENOUS

## 2023-03-15 MED ORDER — LIDOCAINE HCL (PF) 2 % IJ SOLN
INTRAMUSCULAR | Status: AC
  Filled 2023-03-15: qty 5

## 2023-03-15 MED ORDER — PROPOFOL 10 MG/ML IV BOLUS
INTRAVENOUS | Status: DC | PRN
  Administered 2023-03-15: 50 mg via INTRAVENOUS
  Administered 2023-03-15: 20 mg via INTRAVENOUS
  Administered 2023-03-15: 30 mg via INTRAVENOUS
  Administered 2023-03-15 (×2): 20 mg via INTRAVENOUS
  Administered 2023-03-15: 80 mg via INTRAVENOUS
  Administered 2023-03-15: 20 mg via INTRAVENOUS

## 2023-03-15 NOTE — Anesthesia Postprocedure Evaluation (Signed)
Anesthesia Post Note  Patient: Christie Lin  Procedure(s) Performed: COLONOSCOPY WITH PROPOFOL ESOPHAGOGASTRODUODENOSCOPY (EGD) WITH PROPOFOL  Patient location during evaluation: Endoscopy Anesthesia Type: General Level of consciousness: awake and alert Pain management: pain level controlled Vital Signs Assessment: post-procedure vital signs reviewed and stable Respiratory status: spontaneous breathing, nonlabored ventilation, respiratory function stable and patient connected to nasal cannula oxygen Cardiovascular status: blood pressure returned to baseline and stable Postop Assessment: no apparent nausea or vomiting Anesthetic complications: no   No notable events documented.   Last Vitals:  Vitals:   03/15/23 0857 03/15/23 0949  BP: 120/82 (!) 86/47  Pulse: 71   Resp: 16   Temp: (!) 36.2 C (!) 36.1 C  SpO2: 100%     Last Pain:  Vitals:   03/15/23 1009  TempSrc:   PainSc: 0-No pain                 Cleda Mccreedy Seren Chaloux

## 2023-03-15 NOTE — Op Note (Signed)
[facility] Gastroenterology Patient Name: Christie Lin Procedure Date: 03/15/2023 9:14 AM MRN: [id number] Account #: [id number] Date of Birth: 02-15-1951 Admit Type: Outpatient Age: 72 Room: [facility] ENDO ROOM 3 Gender: Female Note Status: Finalized Instrument Name: Upper Endoscope [id number] Procedure:             Upper GI endoscopy Indications:           Dysphagia Providers:             Midge Minium MD, MD Referring MD:          Midge Minium MD, MD (Referring MD) Medicines:             Propofol per Anesthesia Complications:         No immediate complications. Procedure:             Pre-Anesthesia Assessment:                        - Prior to the procedure, a History and Physical was                         performed, and patient medications and allergies were                         reviewed. The patient's tolerance of previous                         anesthesia was also reviewed. The risks and benefits                         of the procedure and the sedation options and risks                         were discussed with the patient. All questions were                         answered, and informed consent was obtained. Prior                         Anticoagulants: The patient has taken no anticoagulant                         or antiplatelet agents. ASA Grade Assessment: II - A                         patient with mild systemic disease. After reviewing                         the risks and benefits, the patient was deemed in                         satisfactory condition to undergo the procedure.                        After obtaining informed consent, the endoscope was                         passed under direct vision. Throughout the procedure,  the patient's blood pressure, pulse, and oxygen                         saturations were monitored continuously. The Endoscope                         was introduced through the mouth, and advanced to  the                         second part of duodenum. The upper GI endoscopy was                         accomplished without difficulty. The patient tolerated                         the procedure well. Findings:      A small hiatal hernia was present.      A TTS dilator was passed through the scope. Dilation with a 15-16.5-18       mm balloon dilator was performed to 18 mm in the entire esophagus.      The stomach was normal.      The examined duodenum was normal. Impression:            - Small hiatal hernia.                        - Normal stomach.                        - Normal examined duodenum.                        - Dilation performed in the entire esophagus.                        - No specimens collected. Recommendation:        - Discharge patient to home.                        - Resume previous diet.                        - Continue present medications.                        - Perform a colonoscopy today. Procedure Code(s):     --- Professional ---                        [phone], Esophagogastroduodenoscopy, flexible,                         transoral; with transendoscopic balloon dilation of                         esophagus (less than 30 mm diameter) Diagnosis Code(s):     --- Professional ---                        R13.10, Dysphagia, unspecified CPT copyright 2022 American Medical Association. All rights reserved. The codes documented in this report are preliminary and upon coder review may  be revised to meet current compliance requirements. Midge Minium MD, MD 03/15/2023 9:29:14 AM This report has been signed electronically. Number of Addenda: 0 Note Initiated On: 03/15/2023 9:14 AM Estimated Blood Loss:  Estimated blood loss: none.      [facility] [place]

## 2023-03-15 NOTE — H&P (Signed)
Midge Minium, MD [facility] [address] [place], [place] [address] Phone:[phone] Fax : [phone]  Primary Care Physician:  Tower, Audrie Gallus, MD Primary Gastroenterologist:  Dr. Servando Snare  Pre-Procedure History & Physical: HPI:  Christie Lin is a 72 y.o. female is here for an endoscopy and colonoscopy.   Past Medical History:  Diagnosis Date   Anemia    Blood transfusion without reported diagnosis    DDD (degenerative disc disease), lumbar 06/20/2017   L3-4   Depression    grief reaction   Endometriosis    Fall    fell from broken sidewalk   Hypotension    per pt she has fainted from low b/p in the past   Low back pain    Palpitations 2022   no medications   Scoliosis     Past Surgical History:  Procedure Laterality Date   ABLATION ON ENDOMETRIOSIS     CESAREAN SECTION     COLONOSCOPY     ruptured ovarian cyst     TOTAL KNEE ARTHROPLASTY Left 01/20/2022   Procedure: TOTAL KNEE ARTHROPLASTY;  Surgeon: Lyndle Herrlich, MD;  Location: [facility] ORS;  Service: Orthopedics;  Laterality: Left;   TUBAL LIGATION     WISDOM TOOTH EXTRACTION      Prior to Admission medications   Medication Sig Start Date End Date Taking? Authorizing Provider  citalopram (CELEXA) 20 MG tablet TAKE 1 TABLET BY MOUTH EVERY OTHER DAY. 02/09/22  Yes Tower, Audrie Gallus, MD  pantoprazole (PROTONIX) 40 MG tablet Take 1 tablet (40 mg total) by mouth 2 (two) times daily before a meal. 01/26/23  Yes Zamya Culhane, MD  MELATONIN PO Take 1 tablet by mouth at bedtime as needed (sleep).    [provider]  midodrine (PROAMATINE) 10 MG tablet Take 1 tablet (10 mg total) by mouth 3 (three) times daily as needed. 03/17/21   Merwyn Katos, MD  solifenacin (VESICARE) 5 MG tablet Take 1 tablet (5 mg total) by mouth daily. 03/09/23   Tower, Audrie Gallus, MD    Allergies as of 01/28/2023 - Review Complete 01/26/2023  Allergen Reaction Noted   Hydrocodone-acetaminophen Other (See Comments) 01/20/2022    Penicillins Swelling and Rash     Family History  Problem Relation Age of Onset   Cancer Maternal Grandmother        ovarian   Depression Sister    Colon cancer Neg Hx    Esophageal cancer Neg Hx    Pancreatic cancer Neg Hx    Rectal cancer Neg Hx    Stomach cancer Neg Hx     Social History   Socioeconomic History   Marital status: Married    Spouse name: Zollie Beckers   Number of children: 3   Years of education: Not on file   Highest education level: Not on file  Occupational History   Occupation: Runner, broadcasting/film/video  Tobacco Use   Smoking status: Never   Smokeless tobacco: Never  Vaping Use   Vaping Use: Never used  Substance and Sexual Activity   Alcohol use: Yes    Alcohol/week: 0.0 standard drinks of alcohol    Comment: rare   Drug use: No   Sexual activity: Not on file  Other Topics Concern   Not on file  Social History Narrative   Not on file   Social Determinants of Health   Financial Resource Strain: Low Risk  (11/10/2022)   Overall Financial Resource Strain (CARDIA)    Difficulty of Paying Living  Expenses: Not hard at all  Food Insecurity: No Food Insecurity (11/10/2022)   Hunger Vital Sign    Worried About Running Out of Food in the Last Year: Never true    Ran Out of Food in the Last Year: Never true  Transportation Needs: No Transportation Needs (11/10/2022)   PRAPARE - Administrator, Civil Service (Medical): No    Lack of Transportation (Non-Medical): No  Physical Activity: Sufficiently Active (11/10/2022)   Exercise Vital Sign    Days of Exercise per Week: 4 days    Minutes of Exercise per Session: 60 min  Stress: No Stress Concern Present (11/10/2022)   [employer] of Occupational Health - Occupational Stress Questionnaire    Feeling of Stress : Not at all  Social Connections: Moderately Integrated (11/10/2022)   Social Connection and Isolation Panel [NHANES]    Frequency of Communication with Friends and Family: More than three times a week     Frequency of Social Gatherings with Friends and Family: More than three times a week    Attends Religious Services: More than 4 times per year    Active Member of [employer] or Organizations: No    Attends Banker Meetings: Never    Marital Status: Married  Catering manager Violence: Not At Risk (11/10/2022)   Humiliation, Afraid, Rape, and Kick questionnaire    Fear of Current or Ex-Partner: No    Emotionally Abused: No    Physically Abused: No    Sexually Abused: No    Review of Systems: See HPI, otherwise negative ROS  Physical Exam: BP 120/82   Pulse 71   Temp (!) 97.1 F (36.2 C) (Temporal)   Resp 16   Wt 55.8 kg   SpO2 100%   BMI 19.85 kg/m  General:   Alert,  pleasant and cooperative in NAD Head:  Normocephalic and atraumatic. Neck:  Supple; no masses or thyromegaly. Lungs:  Clear throughout to auscultation.    Heart:  Regular rate and rhythm. Abdomen:  Soft, nontender and nondistended. Normal bowel sounds, without guarding, and without rebound.   Neurologic:  Alert and  oriented x4;  grossly normal neurologically.  Impression/Plan: Christie Lin is here for an endoscopy and colonoscopy to be performed for dysphagia and a history of adenomatous polyps on 2017   Risks, benefits, limitations, and alternatives regarding  endoscopy and colonoscopy have been reviewed with the patient.  Questions have been answered.  All parties agreeable.   Midge Minium, MD  03/15/2023, 9:12 AM

## 2023-03-15 NOTE — Anesthesia Preprocedure Evaluation (Signed)
Anesthesia Evaluation  Patient identified by MRN, date of birth, ID band Patient awake    Reviewed: Allergy & Precautions, NPO status , Patient's Chart, lab work & pertinent test results  History of Anesthesia Complications Negative for: history of anesthetic complications  Airway Mallampati: III  TM Distance: <3 FB Neck ROM: full    Dental  (+) Chipped   Pulmonary neg pulmonary ROS, neg shortness of breath   Pulmonary exam normal        Cardiovascular Exercise Tolerance: Good (-) angina (-) Past MI Normal cardiovascular exam  Patient states that her baseline BP is around 90/60   Neuro/Psych  PSYCHIATRIC DISORDERS      negative neurological ROS     GI/Hepatic Neg liver ROS,GERD  Controlled,,  Endo/Other  negative endocrine ROS    Renal/GU negative Renal ROS  negative genitourinary   Musculoskeletal   Abdominal   Peds  Hematology negative hematology ROS (+)   Anesthesia Other Findings Past Medical History: No date: Anemia No date: Blood transfusion without reported diagnosis 06/20/2017: DDD (degenerative disc disease), lumbar     Comment:  L3-4 No date: Depression     Comment:  grief reaction No date: Endometriosis No date: Fall     Comment:  fell from broken sidewalk No date: Hypotension     Comment:  per pt she has fainted from low b/p in the past No date: Low back pain 2022: Palpitations     Comment:  no medications No date: Scoliosis  Past Surgical History: No date: ABLATION ON ENDOMETRIOSIS No date: CESAREAN SECTION No date: COLONOSCOPY No date: ruptured ovarian cyst 01/20/2022: TOTAL KNEE ARTHROPLASTY; Left     Comment:  Procedure: TOTAL KNEE ARTHROPLASTY;  Surgeon: Lyndle Herrlich, MD;  Location: [facility] ORS;  Service: Orthopedics;               Laterality: Left; No date: TUBAL LIGATION No date: WISDOM TOOTH EXTRACTION  BMI    Body Mass Index: 19.85 kg/m       Reproductive/Obstetrics negative OB ROS                             Anesthesia Physical Anesthesia Plan  ASA: 2  Anesthesia Plan: General   Post-op Pain Management:    Induction: Intravenous  PONV Risk Score and Plan: Propofol infusion and TIVA  Airway Management Planned: Natural Airway and Nasal Cannula  Additional Equipment:   Intra-op Plan:   Post-operative Plan:   Informed Consent: I have reviewed the patients History and Physical, chart, labs and discussed the procedure including the risks, benefits and alternatives for the proposed anesthesia with the patient or authorized representative who has indicated his/her understanding and acceptance.     Dental Advisory Given  Plan Discussed with: Anesthesiologist, CRNA and Surgeon  Anesthesia Plan Comments: (Patient consented for risks of anesthesia including but not limited to:  - adverse reactions to medications - risk of airway placement if required - damage to eyes, teeth, lips or other oral mucosa - nerve damage due to positioning  - sore throat or hoarseness - Damage to heart, brain, nerves, lungs, other parts of body or loss of life  Patient voiced understanding.)       Anesthesia Quick Evaluation

## 2023-03-15 NOTE — Transfer of Care (Signed)
Immediate Anesthesia Transfer of Care Note  Patient: Christie Lin  Procedure(s) Performed: COLONOSCOPY WITH PROPOFOL ESOPHAGOGASTRODUODENOSCOPY (EGD) WITH PROPOFOL  Patient Location: PACU  Anesthesia Type:General  Level of Consciousness: drowsy  Airway & Oxygen Therapy: Patient Spontanous Breathing and Patient connected to nasal cannula oxygen  Post-op Assessment: Report given to RN and Post -op Vital signs reviewed and stable  Post vital signs: Reviewed and stable  Last Vitals:  Vitals Value Taken Time  BP 86/47 03/15/23 0949  Temp 36.1 C 03/15/23 0949  Pulse 77 03/15/23 0950  Resp 16 03/15/23 0950  SpO2 97 % 03/15/23 0950  Vitals shown include unvalidated device data.  Last Pain:  Vitals:   03/15/23 0949  TempSrc: Temporal  PainSc: Asleep         Complications: No notable events documented.

## 2023-03-15 NOTE — Op Note (Signed)
[facility] Patient Name: Christie Lin Procedure Date: 03/15/2023 9:13 AM MRN: [id number] Account #: [id number] Date of Birth: 12-Oct-1950 Admit Type: Outpatient Age: 72 Room: [facility] ENDO ROOM 3 Gender: Female Note Status: Finalized Instrument Name: Prentice Docker [id number] Procedure:             Colonoscopy Indications:           High risk colon cancer surveillance: Personal history                         of colonic polyps Providers:             Midge Minium MD, MD Referring MD:          Midge Minium MD, MD (Referring MD) Medicines:             Propofol per Anesthesia Complications:         No immediate complications. Procedure:             Pre-Anesthesia Assessment:                        - Prior to the procedure, a History and Physical was                         performed, and patient medications and allergies were                         reviewed. The patient's tolerance of previous                         anesthesia was also reviewed. The risks and benefits                         of the procedure and the sedation options and risks                         were discussed with the patient. All questions were                         answered, and informed consent was obtained. Prior                         Anticoagulants: The patient has taken no anticoagulant                         or antiplatelet agents. ASA Grade Assessment: II - A                         patient with mild systemic disease. After reviewing                         the risks and benefits, the patient was deemed in                         satisfactory condition to undergo the procedure.                        After obtaining informed consent, the colonoscope was  passed under direct vision. Throughout the procedure,                         the patient's blood pressure, pulse, and oxygen                         saturations were monitored continuously. The                          Colonoscope was introduced through the anus and                         advanced to the the cecum, identified by appendiceal                         orifice and ileocecal valve. The colonoscopy was                         performed without difficulty. The patient tolerated                         the procedure well. The quality of the bowel                         preparation was excellent. Findings:      The perianal and digital rectal examinations were normal.      Three sessile polyps were found in the ascending colon. The polyps were       2 to 6 mm in size. These polyps were removed with a cold snare.       Resection and retrieval were complete.      A 4 mm polyp was found in the descending colon. The polyp was sessile.       The polyp was removed with a cold snare. Resection and retrieval were       complete.      Multiple small-mouthed diverticula were found in the sigmoid colon.      Non-bleeding internal hemorrhoids were found during retroflexion. The       hemorrhoids were Grade II (internal hemorrhoids that prolapse but reduce       spontaneously). Impression:            - Three 2 to 6 mm polyps in the ascending colon,                         removed with a cold snare. Resected and retrieved.                        - One 4 mm polyp in the descending colon, removed with                         a cold snare. Resected and retrieved.                        - Diverticulosis in the sigmoid colon.                        - Non-bleeding internal hemorrhoids. Recommendation:        - Discharge patient to home.                        -  Resume previous diet.                        - Continue present medications.                        - Await pathology results.                        - Repeat colonoscopy in 5 years for surveillance. Procedure Code(s):     --- Professional ---                        [phone], Colonoscopy, flexible; with removal of                          tumor(s), polyp(s), or other lesion(s) by snare                         technique Diagnosis Code(s):     --- Professional ---                        Z86.010, Personal history of colonic polyps                        D12.4, Benign neoplasm of descending colon CPT copyright 2022 American Medical Association. All rights reserved. The codes documented in this report are preliminary and upon coder review may  be revised to meet current compliance requirements. Midge Minium MD, MD 03/15/2023 9:47:50 AM This report has been signed electronically. Number of Addenda: 0 Note Initiated On: 03/15/2023 9:13 AM Scope Withdrawal Time: 0 hours 8 minutes 40 seconds  Total Procedure Duration: 0 hours 14 minutes 11 seconds  Estimated Blood Loss:  Estimated blood loss: none.      [facility]

## 2023-03-16 ENCOUNTER — Encounter: Payer: Self-pay | Admitting: Gastroenterology

## 2023-03-17 ENCOUNTER — Encounter: Payer: Self-pay | Admitting: Gastroenterology

## 2023-04-27 ENCOUNTER — Other Ambulatory Visit: Payer: Self-pay | Admitting: Family Medicine

## 2023-05-04 ENCOUNTER — Telehealth: Payer: Self-pay | Admitting: Family Medicine

## 2023-05-04 ENCOUNTER — Other Ambulatory Visit: Payer: Self-pay | Admitting: Family Medicine

## 2023-05-04 NOTE — Telephone Encounter (Signed)
Patient called in regarding medication citalopram (CELEXA) 20 MG tablet says pharmacy has not received refill request. At first glance it seems to be refilled, but it shows receipt to pharmacy failed. Needing this sent again to pharmacy so patient may pick up this medication

## 2023-05-04 NOTE — Telephone Encounter (Signed)
Rx resent.

## 2023-05-05 ENCOUNTER — Ambulatory Visit (INDEPENDENT_AMBULATORY_CARE_PROVIDER_SITE_OTHER): Payer: PPO | Admitting: Urology

## 2023-05-05 ENCOUNTER — Encounter: Payer: Self-pay | Admitting: Urology

## 2023-05-05 VITALS — BP 101/66 | HR 76 | Wt 126.7 lb

## 2023-05-05 DIAGNOSIS — N3281 Overactive bladder: Secondary | ICD-10-CM | POA: Diagnosis not present

## 2023-05-05 DIAGNOSIS — M545 Low back pain, unspecified: Secondary | ICD-10-CM | POA: Diagnosis not present

## 2023-05-05 DIAGNOSIS — N3941 Urge incontinence: Secondary | ICD-10-CM

## 2023-05-05 DIAGNOSIS — N393 Stress incontinence (female) (male): Secondary | ICD-10-CM

## 2023-05-05 DIAGNOSIS — N3946 Mixed incontinence: Secondary | ICD-10-CM | POA: Diagnosis not present

## 2023-05-05 LAB — URINALYSIS, COMPLETE
Bilirubin, UA: NEGATIVE
Glucose, UA: NEGATIVE
Ketones, UA: NEGATIVE
Leukocytes,UA: NEGATIVE
Nitrite, UA: NEGATIVE
Protein,UA: NEGATIVE
RBC, UA: NEGATIVE
Specific Gravity, UA: 1.025 (ref 1.005–1.030)
Urobilinogen, Ur: 0.2 mg/dL (ref 0.2–1.0)
pH, UA: 5.5 (ref 5.0–7.5)

## 2023-05-05 LAB — MICROSCOPIC EXAMINATION

## 2023-05-05 LAB — BLADDER SCAN AMB NON-IMAGING

## 2023-05-05 MED ORDER — GEMTESA 75 MG PO TABS: 75.0000 mg | ORAL_TABLET | Freq: Every day | ORAL | Status: DC

## 2023-05-05 NOTE — Progress Notes (Signed)
I,Dina M Abdulla,acting as a scribe for Riki Altes, MD.,have documented all relevant documentation on the behalf of Riki Altes, MD,as directed by  Riki Altes, MD while in the presence of Riki Altes, MD.  05/05/2023 1:19 PM   Christie Lin 1951/06/22 [id number]  Referring provider: Judy Pimple, MD [address] [place],  [place] [address]  Chief Complaint  Patient presents with   Over Active Bladder    HPI: Christie Lin is a 72 y.o. female referred for evaluation of urinary frequency.  1 year history of progressively worsening frequency/urgency. She voids frequently during the day, up to 11 times per day and has limited her fluid intake. She has had nocturia x3+ per night. She has intense urinary urgency and typically when changes from sitting to standing position, will have intense urge, which she is unable to supress, resulting in incontinence. She also has stress incontinence, but her symptoms are much more bothersome. Complains of severe low back pain and state she was diagnosed with degenerative disc disease several years ago with no recent follow up or evaluation. Apparantly had bladder cystotomy during a c-section in 1989 and "bladder tack" performed at the time of this repair. She has had 3 children, the first 2 were vaginal deliveries for 9-10 pound babies and her third c-section was for a 12 pound baby. She was given a trial of Vesicare, which she tried for one month with mild improvement in her symptoms.   PMH: Past Medical History:  Diagnosis Date   Anemia    Blood transfusion without reported diagnosis    DDD (degenerative disc disease), lumbar 06/20/2017   L3-4   Depression    grief reaction   Endometriosis    Fall    fell from broken sidewalk   Hypotension    per pt she has fainted from low b/p in the past   Low back pain    Palpitations 2022   no medications   Scoliosis     Surgical History: Past Surgical History:   Procedure Laterality Date   ABLATION ON ENDOMETRIOSIS     BALLOON DILATION  03/15/2023   Procedure: BALLOON DILATION;  Surgeon: Midge Minium, MD;  Location: [facility] ENDOSCOPY;  Service: Endoscopy;;   CESAREAN SECTION     COLONOSCOPY     COLONOSCOPY WITH PROPOFOL N/A 03/15/2023   Procedure: COLONOSCOPY WITH PROPOFOL;  Surgeon: Midge Minium, MD;  Location: [facility] ENDOSCOPY;  Service: Endoscopy;  Laterality: N/A;   ESOPHAGOGASTRODUODENOSCOPY (EGD) WITH PROPOFOL N/A 03/15/2023   Procedure: ESOPHAGOGASTRODUODENOSCOPY (EGD) WITH PROPOFOL;  Surgeon: Midge Minium, MD;  Location: [facility] ENDOSCOPY;  Service: Endoscopy;  Laterality: N/A;   POLYPECTOMY  03/15/2023   Procedure: POLYPECTOMY;  Surgeon: Midge Minium, MD;  Location: [facility] ENDOSCOPY;  Service: Endoscopy;;   ruptured ovarian cyst     TOTAL KNEE ARTHROPLASTY Left 01/20/2022   Procedure: TOTAL KNEE ARTHROPLASTY;  Surgeon: Lyndle Herrlich, MD;  Location: [facility] ORS;  Service: Orthopedics;  Laterality: Left;   TUBAL LIGATION     WISDOM TOOTH EXTRACTION      Home Medications:  Allergies as of 05/05/2023       Reactions   Hydrocodone-acetaminophen Other (See Comments)   Drop BP   Penicillins Swelling, Rash   Throat swelling, rash        Medication List        Accurate as of May 05, 2023  1:19 PM. If you have any questions, ask your nurse or doctor.  citalopram 20 MG tablet Commonly known as: CELEXA TAKE 1 TABLET BY MOUTH EVERY OTHER DAY.   Gemtesa 75 MG Tabs Generic drug: Vibegron Take 1 tablet (75 mg total) by mouth daily. Started by: Riki Altes   MELATONIN PO Take 1 tablet by mouth at bedtime as needed (sleep).   midodrine 10 MG tablet Commonly known as: PROAMATINE Take 1 tablet (10 mg total) by mouth 3 (three) times daily as needed.   pantoprazole 40 MG tablet Commonly known as: PROTONIX Take 1 tablet (40 mg total) by mouth 2 (two) times daily before a meal.   solifenacin 5 MG tablet Commonly known as:  VESICARE Take 1 tablet (5 mg total) by mouth daily.        Allergies:  Allergies  Allergen Reactions   Hydrocodone-Acetaminophen Other (See Comments)    Drop BP   Penicillins Swelling and Rash    Throat swelling, rash    Family History: Family History  Problem Relation Age of Onset   Cancer Maternal Grandmother        ovarian   Depression Sister    Colon cancer Neg Hx    Esophageal cancer Neg Hx    Pancreatic cancer Neg Hx    Rectal cancer Neg Hx    Stomach cancer Neg Hx     Social History:  reports that she has never smoked. She has never used smokeless tobacco. She reports current alcohol use. She reports that she does not use drugs.   Physical Exam: BP 101/66   Pulse 76   Wt 126 lb 11.2 oz (57.5 kg)   BMI 20.45 kg/m   Constitutional:  Alert and oriented, No acute distress. HEENT:  AT Respiratory: Normal respiratory effort, no increased work of breathing.Marland Kitchen Psychiatric: Normal mood and affect.   Laboratory Data:  Urinalysis Dipstick/microscopy negative.   Assessment & Plan:    1. Overactive bladder with urge incontinence Severe symptoms. PVR 0 mL. History of severe back pain and degenerative disc disease, which she would like to have further evaluated. Trial Gemtesa 75 mg daily. Due to severity of her symptoms, we'll have her follow up with Dr. Sherron Monday to discuss second line options if medical management has not been effective. Neurosurgery referral placed for evaluation of her back pain.  2. Stress urinary incontinence Mild symptoms in comparison to her urge incontinence.   I have reviewed the above documentation for accuracy and completeness, and I agree with the above.   Riki Altes, MD  [facility] [address] [place], [place] [address] [phone]

## 2023-06-01 ENCOUNTER — Telehealth: Payer: Self-pay | Admitting: Urology

## 2023-06-01 ENCOUNTER — Other Ambulatory Visit: Payer: Self-pay | Admitting: *Deleted

## 2023-06-01 MED ORDER — GEMTESA 75 MG PO TABS: 75.0000 mg | ORAL_TABLET | Freq: Every day | ORAL | 11 refills | Status: DC

## 2023-06-01 NOTE — Telephone Encounter (Signed)
Medication sent. Notified patient.

## 2023-06-01 NOTE — Telephone Encounter (Signed)
Pt LMOM Gemtesa 75 mg samples worked wonderful and she would like a new RX sent to [facility].

## 2023-06-02 NOTE — Progress Notes (Signed)
Referring Physician:  Riki Altes, MD [address] [place],  [place] [address]  Primary Physician:  Tower, Audrie Gallus, MD  History of Present Illness: 06/06/2023 [name] has a history of GERD, anemia, endometriosis.   She has chronic intermittent LBP x years that has been worse in last year. She pain into her buttocks but no leg pain. Pain is worse when she goes from sitting/laying to standing. She has improvement with sitting. She has pain with prolonged walking- she tries to support her back with her arms. No numbness, tingling, or weakness.   Minimal relief with motrin and tylenol.   Bowel/Bladder Dysfunction: 1 year history of urinary urgency- has urge to go, but has leaking/spilling of urine when she stands up. She is seeing urology. No perineal numbness.   Conservative measures:  Physical therapy: none  Multimodal medical therapy including regular antiinflammatories: motrin, tylenol Injections: No epidural steroid injections  Past Surgery: No spinal surgery   Christie Lin has no symptoms of cervical myelopathy.  The symptoms are causing a significant impact on the patient's life.   Review of Systems:  A 10 point review of systems is negative, except for the pertinent positives and negatives detailed in the HPI.  Past Medical History: Past Medical History:  Diagnosis Date   Anemia    Blood transfusion without reported diagnosis    DDD (degenerative disc disease), lumbar 06/20/2017   L3-4   Depression    grief reaction   Endometriosis    Fall    fell from broken sidewalk   Hypotension    per pt she has fainted from low b/p in the past   Low back pain    Palpitations 2022   no medications   Scoliosis     Past Surgical History: Past Surgical History:  Procedure Laterality Date   ABLATION ON ENDOMETRIOSIS     BALLOON DILATION  03/15/2023   Procedure: BALLOON DILATION;  Surgeon: Midge Minium, MD;  Location: [facility] ENDOSCOPY;   Service: Endoscopy;;   CESAREAN SECTION     COLONOSCOPY     COLONOSCOPY WITH PROPOFOL N/A 03/15/2023   Procedure: COLONOSCOPY WITH PROPOFOL;  Surgeon: Midge Minium, MD;  Location: [facility] ENDOSCOPY;  Service: Endoscopy;  Laterality: N/A;   ESOPHAGOGASTRODUODENOSCOPY (EGD) WITH PROPOFOL N/A 03/15/2023   Procedure: ESOPHAGOGASTRODUODENOSCOPY (EGD) WITH PROPOFOL;  Surgeon: Midge Minium, MD;  Location: [facility] ENDOSCOPY;  Service: Endoscopy;  Laterality: N/A;   POLYPECTOMY  03/15/2023   Procedure: POLYPECTOMY;  Surgeon: Midge Minium, MD;  Location: [facility] ENDOSCOPY;  Service: Endoscopy;;   ruptured ovarian cyst     TOTAL KNEE ARTHROPLASTY Left 01/20/2022   Procedure: TOTAL KNEE ARTHROPLASTY;  Surgeon: Lyndle Herrlich, MD;  Location: [facility] ORS;  Service: Orthopedics;  Laterality: Left;   TUBAL LIGATION     WISDOM TOOTH EXTRACTION      Allergies: Allergies as of 06/06/2023 - Review Complete 06/06/2023  Allergen Reaction Noted   Hydrocodone-acetaminophen Other (See Comments) 01/20/2022   Penicillins Swelling and Rash     Medications: Outpatient Encounter Medications as of 06/06/2023  Medication Sig   citalopram (CELEXA) 20 MG tablet TAKE 1 TABLET BY MOUTH EVERY OTHER DAY.   MELATONIN PO Take 1 tablet by mouth at bedtime as needed (sleep).   midodrine (PROAMATINE) 10 MG tablet Take 1 tablet (10 mg total) by mouth 3 (three) times daily as needed.   Vibegron (GEMTESA) 75 MG TABS Take 1 tablet (75 mg total) by mouth daily.   [  DISCONTINUED] pantoprazole (PROTONIX) 40 MG tablet Take 1 tablet (40 mg total) by mouth 2 (two) times daily before a meal.   [DISCONTINUED] solifenacin (VESICARE) 5 MG tablet Take 1 tablet (5 mg total) by mouth daily.   No facility-administered encounter medications on file as of 06/06/2023.    Social History: Social History   Tobacco Use   Smoking status: Never   Smokeless tobacco: Never  Vaping Use   Vaping status: Never Used  Substance Use Topics   Alcohol use: Yes     Alcohol/week: 0.0 standard drinks of alcohol    Comment: rare   Drug use: No    Family Medical History: Family History  Problem Relation Age of Onset   Cancer Maternal Grandmother        ovarian   Depression Sister    Colon cancer Neg Hx    Esophageal cancer Neg Hx    Pancreatic cancer Neg Hx    Rectal cancer Neg Hx    Stomach cancer Neg Hx     Physical Examination: Vitals:   06/06/23 1054  BP: 115/74    General: Patient is well developed, well nourished, calm, collected, and in no apparent distress. Attention to examination is appropriate.  Respiratory: Patient is breathing without any difficulty.   NEUROLOGICAL:     Awake, alert, oriented to person, place, and time.  Speech is clear and fluent. Fund of knowledge is appropriate.   Cranial Nerves: Pupils equal round and reactive to light.  Facial tone is symmetric.    Mild central lower posterior lumbar tenderness.   No abnormal lesions on exposed skin.   Strength: Side Biceps Triceps Deltoid Interossei Grip Wrist Ext. Wrist Flex.  R 5 5 5 5 5 5 5   L 5 5 5 5 5 5 5    Side Iliopsoas Quads Hamstring PF DF EHL  R 5 5 5 5 5 5   L 5 5 5 5 5 5    Reflexes are 2+ and symmetric at the biceps, brachioradialis, patella and achilles.   Hoffman's is absent.  Clonus is not present.   Bilateral upper and lower extremity sensation is intact to light touch.     No pain with IR/ER of her hips.   Gait is normal.     Medical Decision Making  Imaging: Lumbar xrays dated 08/29/19:  FINDINGS: This report assumes 5 non rib-bearing lumbar vertebrae. Mild levocurvature of the lumbar spine.   Lumbar vertebral body heights are preserved, with no fracture.   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 Stable mild 3 mm retrolisthesis at L2-3 and minimal 2 mm retrolisthesis at L3-4 and L4-5. Mild bilateral lower lumbar facet arthropathy. No  aggressive appearing focal osseous lesions.   IMPRESSION: 1. 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 2. Stable mild multilevel lumbar spondylolisthesis. 3. Mild levocurvature of the lumbar spine.     Electronically Signed   By: Delbert Phenix M.D.   On: 08/29/2019 14:56    Thoracic xrays dated 08/29/19:  FINDINGS: Mild dextrocurvature of the thoracic spine, similar. Thoracic vertebral body heights are preserved, with no thoracic spine fracture or subluxation. Minimal multilevel thoracic spondylosis unchanged. No suspicious focal osseous lesions.   IMPRESSION: No acute osseous abnormality. Minimal multilevel thoracic spondylosis, unchanged. Similar mild dextrocurvature of the thoracic spine.     Electronically Signed   By: Delbert Phenix M.D.   On: 08/29/2019 14:59  I  have personally reviewed the images and agree with the above interpretation.  Assessment and Plan: Ms. Mckee is a pleasant 72 y.o. female who has chronic intermittent LBP x years that has been worse in last year. She has pain into her buttocks but no leg pain. Pain is worse when she goes from sitting/laying to standing- sometimes her husband has to pick her up to get out of the car.   Previous lumbar xrays from 2020 showed lumbar spondylosis and DDD. Current pain appears to be lumbar mediated.   Treatment options discussed with patient and following plan made:   - MRI of lumbar spine to evaluate chronic LBP. No improvement with medications or time.  - Prescription for robaxin to take prn muscle spasms. Reviewed dosing and side effects. Discussed this can cause drowsiness.  - She will restart mobic 15mg  that she has at home. Reviewed dosing and side effects. Take with food. Stop motrin/tylenol.  - Will schedule follow up visit to review MRI results once I get them back. She is travelling to [place] at the end of September.   Of note, I  do not think her urinary urgency is spine mediated. This has been going on for over a year. She has no associated  perineal numbness, no leg numbness or weakness. No bowel or bladder incontinence. She will follow up with urology.   I spent a total of 30 minutes in face-to-face and non-face-to-face activities related to this patient's care today including review of outside records, review of imaging, review of symptoms, physical exam, discussion of differential diagnosis, discussion of treatment options, and documentation.   Thank you for involving me in the care of this patient.   Drake Leach PA-C Dept. of Neurosurgery

## 2023-06-06 ENCOUNTER — Encounter: Payer: Self-pay | Admitting: Orthopedic Surgery

## 2023-06-06 ENCOUNTER — Ambulatory Visit (INDEPENDENT_AMBULATORY_CARE_PROVIDER_SITE_OTHER): Payer: PPO | Admitting: Orthopedic Surgery

## 2023-06-06 VITALS — BP 115/74 | Ht 66.0 in | Wt 125.6 lb

## 2023-06-06 DIAGNOSIS — M47816 Spondylosis without myelopathy or radiculopathy, lumbar region: Secondary | ICD-10-CM | POA: Diagnosis not present

## 2023-06-06 DIAGNOSIS — M5136 Other intervertebral disc degeneration, lumbar region: Secondary | ICD-10-CM

## 2023-06-06 MED ORDER — METHOCARBAMOL 500 MG PO TABS
500.0000 mg | ORAL_TABLET | Freq: Three times a day (TID) | ORAL | 0 refills | Status: DC | PRN
Start: 2023-06-06 — End: 2023-06-13

## 2023-06-06 NOTE — Patient Instructions (Addendum)
It was so nice to see you today. Thank you so much for coming in.    I want to get an MRI of your lower back to look into things further. We will get this approved through your insurance and [facility] will call you to schedule the appointment.   [facility] (building with the white pillars) is located off of [address]. The address is [address] [place], [place] [address].   After you have the MRI, it takes 10-14 days for me to get the results back. Once I have them, we will call you to schedule a follow up visit with me to review them.   You can take the meloxicam (make sure it's the 15 mg) you have at home to help with pain and inflammation. Take as directed with food. Stop the motrin and tylenol.   I also sent a prescription for methocarbamol to help with muscle spasms. Use only as needed and be careful, this can make you sleepy.   I don't think your bladder issues are from your back. I would follow up with urology.   Please do not hesitate to call if you have any questions or concerns. You can also message me in MyChart.   Drake Leach PA-C [phone]

## 2023-06-07 ENCOUNTER — Ambulatory Visit
Admission: RE | Admit: 2023-06-07 | Discharge: 2023-06-07 | Disposition: A | Discharge status: Home / Self Care | Payer: PPO | Source: Ambulatory Visit | Attending: Orthopedic Surgery | Admitting: Orthopedic Surgery

## 2023-06-07 DIAGNOSIS — M4805 Spinal stenosis, thoracolumbar region: Secondary | ICD-10-CM | POA: Diagnosis not present

## 2023-06-07 DIAGNOSIS — M47816 Spondylosis without myelopathy or radiculopathy, lumbar region: Secondary | ICD-10-CM | POA: Insufficient documentation

## 2023-06-07 DIAGNOSIS — M419 Scoliosis, unspecified: Secondary | ICD-10-CM | POA: Diagnosis not present

## 2023-06-07 DIAGNOSIS — M5126 Other intervertebral disc displacement, lumbar region: Secondary | ICD-10-CM | POA: Diagnosis not present

## 2023-06-07 DIAGNOSIS — M5136 Other intervertebral disc degeneration, lumbar region: Secondary | ICD-10-CM | POA: Diagnosis not present

## 2023-06-11 DIAGNOSIS — M47816 Spondylosis without myelopathy or radiculopathy, lumbar region: Secondary | ICD-10-CM

## 2023-06-11 DIAGNOSIS — M5136 Other intervertebral disc degeneration, lumbar region: Secondary | ICD-10-CM

## 2023-06-13 ENCOUNTER — Ambulatory Visit: Payer: PPO | Admitting: Urology

## 2023-06-13 DIAGNOSIS — N3281 Overactive bladder: Secondary | ICD-10-CM | POA: Diagnosis not present

## 2023-06-13 DIAGNOSIS — N3946 Mixed incontinence: Secondary | ICD-10-CM | POA: Diagnosis not present

## 2023-06-13 LAB — MICROSCOPIC EXAMINATION

## 2023-06-13 LAB — URINALYSIS, COMPLETE
Bilirubin, UA: NEGATIVE
Glucose, UA: NEGATIVE
Ketones, UA: NEGATIVE
Nitrite, UA: NEGATIVE
Protein,UA: NEGATIVE
RBC, UA: NEGATIVE
Specific Gravity, UA: 1.01 (ref 1.005–1.030)
Urobilinogen, Ur: 0.2 mg/dL (ref 0.2–1.0)
pH, UA: 7 (ref 5.0–7.5)

## 2023-06-13 MED ORDER — CYCLOBENZAPRINE HCL 10 MG PO TABS
10.0000 mg | ORAL_TABLET | Freq: Three times a day (TID) | ORAL | 0 refills | Status: DC | PRN
Start: 2023-06-13 — End: 2023-07-20

## 2023-06-13 MED ORDER — GEMTESA 75 MG PO TABS: 75.0000 mg | ORAL_TABLET | Freq: Every day | ORAL | 3 refills | Status: DC

## 2023-06-13 NOTE — Progress Notes (Signed)
06/13/2023 8:56 AM   Christie Lin 1950/11/12 [id number]  Referring provider: Judy Pimple, MD [address] [place],  [place] [address]  Chief Complaint  Patient presents with   Follow-up    HPI: ST: Mixed incontinence and frequency.  She had a bladder cystotomy during a C-section in 1989 and a bladder suspension at the time of the repair.  Vesicare helped symptoms mildly.  Postvoid residual 0 mL.  Was given Gemtesa.  Today Prior to taking medication patient has urge incontinence.  Sometimes she leaks with laughing sneezing.  No bedwetting.  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  She was wearing up to 3 lighter pads but now 1 or 2 thicker pads per day  On average she was getting up 4 times at night and voiding every hour during the day and cannot hold it for 2 hours.  She often has a slower flow and can void with a double voiding pattern  Gets 2-3 bladder infections a year.  Has not had a hysterectomy  She is now 80 to 90% better on Gemtesa and very pleased.   PMH: Past Medical History:  Diagnosis Date   Anemia    Blood transfusion without reported diagnosis    DDD (degenerative disc disease), lumbar 06/20/2017   L3-4   Depression    grief reaction   Endometriosis    Fall    fell from broken sidewalk   Hypotension    per pt she has fainted from low b/p in the past   Low back pain    Palpitations 2022   no medications   Scoliosis     Surgical History: Past Surgical History:  Procedure Laterality Date   ABLATION ON ENDOMETRIOSIS     BALLOON DILATION  03/15/2023   Procedure: BALLOON DILATION;  Surgeon: Christie Minium, MD;  Location: [facility] ENDOSCOPY;  Service: Endoscopy;;   CESAREAN SECTION     COLONOSCOPY     COLONOSCOPY WITH PROPOFOL N/A 03/15/2023   Procedure: COLONOSCOPY WITH PROPOFOL;  Surgeon: Christie Minium, MD;  Location: [facility] ENDOSCOPY;  Service: Endoscopy;  Laterality: N/A;   ESOPHAGOGASTRODUODENOSCOPY (EGD) WITH PROPOFOL N/A 03/15/2023    Procedure: ESOPHAGOGASTRODUODENOSCOPY (EGD) WITH PROPOFOL;  Surgeon: Christie Minium, MD;  Location: [facility] ENDOSCOPY;  Service: Endoscopy;  Laterality: N/A;   POLYPECTOMY  03/15/2023   Procedure: POLYPECTOMY;  Surgeon: Christie Minium, MD;  Location: [facility] ENDOSCOPY;  Service: Endoscopy;;   ruptured ovarian cyst     TOTAL KNEE ARTHROPLASTY Left 01/20/2022   Procedure: TOTAL KNEE ARTHROPLASTY;  Surgeon: Christie Herrlich, MD;  Location: [facility] ORS;  Service: Orthopedics;  Laterality: Left;   TUBAL LIGATION     WISDOM TOOTH EXTRACTION      Home Medications:  Allergies as of 06/13/2023       Reactions   Hydrocodone-acetaminophen Other (See Comments)   Drop BP   Penicillins Swelling, Rash   Throat swelling, rash        Medication List        Accurate as of June 13, 2023  8:56 AM. If you have any questions, ask your nurse or doctor.          citalopram 20 MG tablet Commonly known as: CELEXA TAKE 1 TABLET BY MOUTH EVERY OTHER DAY.   cyclobenzaprine 10 MG tablet Commonly known as: FLEXERIL Take 1 tablet (10 mg total) by mouth 3 (three) times daily as needed for muscle spasms. This can make you sleepy.   Gemtesa 75 MG  Tabs Generic drug: Vibegron Take 1 tablet (75 mg total) by mouth daily.   MELATONIN PO Take 1 tablet by mouth at bedtime as needed (sleep).   midodrine 10 MG tablet Commonly known as: PROAMATINE Take 1 tablet (10 mg total) by mouth 3 (three) times daily as needed.        Allergies:  Allergies  Allergen Reactions   Hydrocodone-Acetaminophen Other (See Comments)    Drop BP   Penicillins Swelling and Rash    Throat swelling, rash    Family History: Family History  Problem Relation Age of Onset   Cancer Maternal Grandmother        ovarian   Depression Sister    Colon cancer Neg Hx    Esophageal cancer Neg Hx    Pancreatic cancer Neg Hx    Rectal cancer Neg Hx    Stomach cancer Neg Hx     Social History:  reports that she has never smoked. She has  never used smokeless tobacco. She reports current alcohol use. She reports that she does not use drugs.  ROS:                                        Physical Exam: There were no vitals taken for this visit.  Constitutional:  Alert and oriented, No acute distress. HEENT: [place] AT, moist mucus membranes.  Trachea midline, no masses. Cardiovascular: No clubbing, cyanosis, or edema. Respiratory: Normal respiratory effort, no increased work of breathing. GI: Abdomen is soft, nontender, nondistended, no abdominal masses GU: Grade 1 hypermobility the bladder neck and no prolapse or stress incontinence Skin: No rashes, bruises or suspicious lesions. Lymph: No cervical or inguinal adenopathy. Neurologic: Grossly intact, no focal deficits, moving all 4 extremities. Psychiatric: Normal mood and affect.  Laboratory Data: Lab Results  Component Value Date   WBC 4.4 01/14/2022   HGB 13.1 01/14/2022   HCT 39.1 01/14/2022   MCV 91.4 01/14/2022   PLT 173 01/14/2022    Lab Results  Component Value Date   CREATININE 0.80 01/14/2022    No results found for: "PSA"  No results found for: "TESTOSTERONE"  No results found for: "HGBA1C"  Urinalysis    Component Value Date/Time   COLORURINE YELLOW (A) 01/14/2022 1525   APPEARANCEUR Clear 05/05/2023 0952   LABSPEC 1.009 01/14/2022 1525   PHURINE 5.0 01/14/2022 1525   GLUCOSEU Negative 05/05/2023 0952   HGBUR NEGATIVE 01/14/2022 1525   BILIRUBINUR Negative 05/05/2023 0952   KETONESUR NEGATIVE 01/14/2022 1525   PROTEINUR Negative 05/05/2023 0952   PROTEINUR NEGATIVE 01/14/2022 1525   UROBILINOGEN 0.2 03/09/2023 1141   NITRITE Negative 05/05/2023 0952   NITRITE NEGATIVE 01/14/2022 1525   LEUKOCYTESUR Negative 05/05/2023 0952   LEUKOCYTESUR NEGATIVE 01/14/2022 1525    Pertinent Imaging: Urine negative.  Assessment & Plan: Has mixed incontinence but primarily overactive bladder.  She is doing so well on Gemtesa I  felt that we did not need to investigate further.  No blood in urine.  Reassurance given.  Gemtesa 90 x 3 sent to pharmacy and I will see near  1. OAB (overactive bladder)  - Urinalysis, Complete   No follow-ups on file.  Christie Sinner, MD  [facility] [address], [address] [place], [place] [address] [phone]

## 2023-07-15 NOTE — Progress Notes (Addendum)
Referring Physician:  Tower, Audrie Gallus, MD [address] [place],  [place] [address]  Primary Physician:  Tower, Audrie Gallus, MD  History of Present Illness: [name] has a history of GERD, anemia, endometriosis.   Last seen by me on 06/06/23 for LBP. Previous lumbar xrays from 2020 showed lumbar spondylosis and DDD.   She was given robaxin and advised to restart mobic she had at home. Robaxin made her hyper and flexeril was sent into her pharmacy.   She is here to review her lumbar MRI scan.   She continues with intermittent LBP/buttock pain with no leg pain. Had flare up last week after doing work at [employer]. Pain is also worse when she goes from sitting/laying to standing. She has improvement with sitting. No numbness, tingling, or weakness.   Minimal relief with motrin and tylenol. She has seen improvement with flexeril.   Bowel/Bladder Dysfunction: 1 year history of urinary urgency- has urge to go, but has leaking/spilling of urine when she stands up. She is seeing urology. No perineal numbness.   Conservative measures:  Physical therapy: none  Multimodal medical therapy including regular antiinflammatories: motrin, tylenol Injections: No epidural steroid injections  Past Surgery: No spinal surgery   MORGYN MELUCCI has no symptoms of cervical myelopathy.  The symptoms are causing a significant impact on the patient's life.   Review of Systems:  A 10 point review of systems is negative, except for the pertinent positives and negatives detailed in the HPI.  Past Medical History: Past Medical History:  Diagnosis Date   Anemia    Blood transfusion without reported diagnosis    DDD (degenerative disc disease), lumbar 06/20/2017   L3-4   Depression    grief reaction   Endometriosis    Fall    fell from broken sidewalk   Hypotension    per pt she has fainted from low b/p in the past   Low back pain    Palpitations 2022   no medications    Scoliosis     Past Surgical History: Past Surgical History:  Procedure Laterality Date   ABLATION ON ENDOMETRIOSIS     BALLOON DILATION  03/15/2023   Procedure: BALLOON DILATION;  Surgeon: Midge Minium, MD;  Location: [facility] ENDOSCOPY;  Service: Endoscopy;;   CESAREAN SECTION     COLONOSCOPY     COLONOSCOPY WITH PROPOFOL N/A 03/15/2023   Procedure: COLONOSCOPY WITH PROPOFOL;  Surgeon: Midge Minium, MD;  Location: [facility] ENDOSCOPY;  Service: Endoscopy;  Laterality: N/A;   ESOPHAGOGASTRODUODENOSCOPY (EGD) WITH PROPOFOL N/A 03/15/2023   Procedure: ESOPHAGOGASTRODUODENOSCOPY (EGD) WITH PROPOFOL;  Surgeon: Midge Minium, MD;  Location: [facility] ENDOSCOPY;  Service: Endoscopy;  Laterality: N/A;   POLYPECTOMY  03/15/2023   Procedure: POLYPECTOMY;  Surgeon: Midge Minium, MD;  Location: [facility] ENDOSCOPY;  Service: Endoscopy;;   ruptured ovarian cyst     TOTAL KNEE ARTHROPLASTY Left 01/20/2022   Procedure: TOTAL KNEE ARTHROPLASTY;  Surgeon: Lyndle Herrlich, MD;  Location: [facility] ORS;  Service: Orthopedics;  Laterality: Left;   TUBAL LIGATION     WISDOM TOOTH EXTRACTION      Allergies: Allergies as of 07/20/2023 - Review Complete 06/13/2023  Allergen Reaction Noted   Hydrocodone-acetaminophen Other (See Comments) 01/20/2022   Penicillins Swelling and Rash     Medications: Outpatient Encounter Medications as of 07/20/2023  Medication Sig   citalopram (CELEXA) 20 MG tablet TAKE 1 TABLET BY MOUTH EVERY OTHER DAY.   cyclobenzaprine (FLEXERIL) 10 MG tablet  Take 1 tablet (10 mg total) by mouth 3 (three) times daily as needed for muscle spasms. This can make you sleepy.   MELATONIN PO Take 1 tablet by mouth at bedtime as needed (sleep).   midodrine (PROAMATINE) 10 MG tablet Take 1 tablet (10 mg total) by mouth 3 (three) times daily as needed.   Vibegron (GEMTESA) 75 MG TABS Take 1 tablet (75 mg total) by mouth daily.   No facility-administered encounter medications on file as of 07/20/2023.    Social  History: Social History   Tobacco Use   Smoking status: Never   Smokeless tobacco: Never  Vaping Use   Vaping status: Never Used  Substance Use Topics   Alcohol use: Yes    Alcohol/week: 0.0 standard drinks of alcohol    Comment: rare   Drug use: No    Family Medical History: Family History  Problem Relation Age of Onset   Cancer Maternal Grandmother        ovarian   Depression Sister    Colon cancer Neg Hx    Esophageal cancer Neg Hx    Pancreatic cancer Neg Hx    Rectal cancer Neg Hx    Stomach cancer Neg Hx     Physical Examination: There were no vitals filed for this visit.    Awake, alert, oriented to person, place, and time.  Speech is clear and fluent. Fund of knowledge is appropriate.   Cranial Nerves: Pupils equal round and reactive to light.  Facial tone is symmetric.    No abnormal lesions on exposed skin.   Strength:  Side Iliopsoas Quads Hamstring PF DF EHL  R 5 5 5 5 5 5   L 5 5 5 5 5 5    Bilateral lower extremity sensation is intact to light touch.     Gait is normal.     Medical Decision Making  Imaging: Lumbar MRI dated 06/07/23:  FINDINGS: Segmentation:  Standard.   Alignment: Hyperlordosis. Mild levoscoliosis and L2-3 retrolisthesis   Vertebrae: No fracture, evidence of discitis, or bone lesion. Mild discogenic endplate edema at R6-0.   Conus medullaris and cauda equina: Conus extends to the L1-2 level. Conus and cauda equina appear normal.   Paraspinal and other soft tissues: Negative for perispinal mass or inflammation. Sigmoid diverticulosis   Disc levels:   T12- L1: Anterior disc narrowing.  No impingement   L1-L2: Unremarkable.   L2-L3: Mild retrolisthesis and disc height loss.   L3-L4: Greatest level of degenerative disc narrowing with endplate degeneration and ridging. The canal and foramina are patent   L4-L5: Minor disc bulging and facet spurring.  No neural impingement   L5-S1:Unremarkable.    IMPRESSION: Lumbar spine degeneration greatest at L3-4 disc space where there is mild discogenic edema. No impingement throughout the lumbar spine.     Electronically Signed   By: Tiburcio Pea M.D.   On: 06/24/2023 13:45  I have personally reviewed the images and agree with the above interpretation.  Assessment and Plan: Ms. Gallogly is a pleasant 72 y.o. female who continues with intermittent LBP/buttock pain with no leg pain. Had flare up last week after doing work at [employer]. No numbness, tingling, or weakness.   She has known slight retrolisthesis L2-L3 alng with DDD L3-L4 and diffuse lumbar spondylosis. LBP likely due to DDD L3-L4 and underlying spondylosis.  Treatment options discussed with patient and following plan made:   - PT orders for lumbar spine to [facility].  - Refill given  on flexeril. Reviewed dosing and side effects. Discussed this can cause drowsiness.  - Referral to PMR at [facility] to discuss possible lumbar injections.  - Will message her in 6 weeks to check on her progress.   I spent a total of 20 minutes in face-to-face and non-face-to-face activities related to this patient's care today including review of outside records, review of imaging, review of symptoms, physical exam, discussion of differential diagnosis, discussion of treatment options, and documentation.   Thank you for involving me in the care of this patient.   Drake Leach PA-C Dept. of Neurosurgery

## 2023-07-20 ENCOUNTER — Ambulatory Visit: Payer: PPO | Admitting: Orthopedic Surgery

## 2023-07-20 ENCOUNTER — Encounter: Payer: Self-pay | Admitting: Orthopedic Surgery

## 2023-07-20 VITALS — BP 114/74 | Ht 66.0 in | Wt 125.0 lb

## 2023-07-20 DIAGNOSIS — M51369 Other intervertebral disc degeneration, lumbar region without mention of lumbar back pain or lower extremity pain: Secondary | ICD-10-CM

## 2023-07-20 DIAGNOSIS — M4316 Spondylolisthesis, lumbar region: Secondary | ICD-10-CM | POA: Diagnosis not present

## 2023-07-20 DIAGNOSIS — M47816 Spondylosis without myelopathy or radiculopathy, lumbar region: Secondary | ICD-10-CM | POA: Diagnosis not present

## 2023-07-20 DIAGNOSIS — M5136 Other intervertebral disc degeneration, lumbar region with discogenic back pain only: Secondary | ICD-10-CM

## 2023-07-20 MED ORDER — CYCLOBENZAPRINE HCL 10 MG PO TABS
10.0000 mg | ORAL_TABLET | Freq: Three times a day (TID) | ORAL | 0 refills | Status: DC | PRN
Start: 2023-07-20 — End: 2023-12-07

## 2023-08-15 DIAGNOSIS — M5451 Vertebrogenic low back pain: Secondary | ICD-10-CM | POA: Diagnosis not present

## 2023-09-02 DIAGNOSIS — M5451 Vertebrogenic low back pain: Secondary | ICD-10-CM | POA: Diagnosis not present

## 2023-09-08 DIAGNOSIS — M5451 Vertebrogenic low back pain: Secondary | ICD-10-CM | POA: Diagnosis not present

## 2023-09-19 DIAGNOSIS — M791 Myalgia, unspecified site: Secondary | ICD-10-CM | POA: Diagnosis not present

## 2023-09-19 DIAGNOSIS — R059 Cough, unspecified: Secondary | ICD-10-CM | POA: Diagnosis not present

## 2023-09-19 DIAGNOSIS — R0982 Postnasal drip: Secondary | ICD-10-CM | POA: Diagnosis not present

## 2023-09-19 DIAGNOSIS — R5383 Other fatigue: Secondary | ICD-10-CM | POA: Diagnosis not present

## 2023-09-19 DIAGNOSIS — J3489 Other specified disorders of nose and nasal sinuses: Secondary | ICD-10-CM | POA: Diagnosis not present

## 2023-10-04 ENCOUNTER — Encounter: Payer: Self-pay | Admitting: Family Medicine

## 2023-10-05 DIAGNOSIS — M5451 Vertebrogenic low back pain: Secondary | ICD-10-CM | POA: Diagnosis not present

## 2023-10-18 DIAGNOSIS — R519 Headache, unspecified: Secondary | ICD-10-CM | POA: Diagnosis not present

## 2023-10-18 DIAGNOSIS — R059 Cough, unspecified: Secondary | ICD-10-CM | POA: Diagnosis not present

## 2023-10-18 DIAGNOSIS — Z20828 Contact with and (suspected) exposure to other viral communicable diseases: Secondary | ICD-10-CM | POA: Diagnosis not present

## 2023-10-18 DIAGNOSIS — J029 Acute pharyngitis, unspecified: Secondary | ICD-10-CM | POA: Diagnosis not present

## 2023-10-18 DIAGNOSIS — J019 Acute sinusitis, unspecified: Secondary | ICD-10-CM | POA: Diagnosis not present

## 2023-11-14 ENCOUNTER — Ambulatory Visit (INDEPENDENT_AMBULATORY_CARE_PROVIDER_SITE_OTHER): Payer: PPO

## 2023-11-14 VITALS — Ht 66.0 in | Wt 125.0 lb

## 2023-11-14 DIAGNOSIS — Z1231 Encounter for screening mammogram for malignant neoplasm of breast: Secondary | ICD-10-CM | POA: Diagnosis not present

## 2023-11-14 DIAGNOSIS — Z0000 Encounter for general adult medical examination without abnormal findings: Secondary | ICD-10-CM | POA: Diagnosis not present

## 2023-11-14 NOTE — Patient Instructions (Signed)
Christie Lin , Thank you for taking time to come for your Medicare Wellness Visit. I appreciate your ongoing commitment to your health goals. Please review the following plan we discussed and let me know if I can assist you in the future.   Referrals/Orders/Follow-Ups/Clinician Recommendations:  Christie Lin , Thank you for taking time to come for your Medicare Wellness Visit. I appreciate your ongoing commitment to your health goals. Please review the following plan we discussed and let me know if I can assist you in the future.   Referrals/Orders/Follow-Ups/Clinician Recommendations:  You have an order for:  []   2D Mammogram  [x]   3D Mammogram  []   Bone Density     Please call for appointment:  [facility]  [address] [place] [address] [phone]  [facility] [address] [place], [place] [address] [phone]  [facility] [address] Geanie Logan [place], [place] [address] [phone]    Make sure to wear two-piece clothing.  No lotions, powders, or deodorants the day of the appointment. Make sure to bring picture ID and insurance card.  Bring list of medications you are currently taking including any supplements.   Schedule your [place] screening mammogram through MyChart!   Log into your MyChart account.  Go to 'Visit' (or 'Appointments' if on mobile App) --> Schedule an Appointment  Under 'Select a Reason for Visit' choose the Mammogram Screening option.  Complete the pre-visit questions and select the time and place that best fits your schedule.    This is a list of the screening recommended for you and due dates:  Health Maintenance  Topic Date Due   Zoster (Shingles) Vaccine (1 of 2) Never done   Pneumonia Vaccine (1 of 1 - PCV) Never done   DEXA scan (bone density measurement)  10/25/2020   Mammogram  01/23/2022    COVID-19 Vaccine (2 - 2024-25 season) 05/29/2023   Flu Shot  12/26/2023*   Medicare Annual Wellness Visit  11/13/2024   Colon Cancer Screening  03/14/2028   DTaP/Tdap/Td vaccine (3 - Td or Tdap) 03/10/2030   Hepatitis C Screening  Completed   HPV Vaccine  Aged Out  *Topic was postponed. The date shown is not the original due date.    Advanced directives: (Declined) Advance directive discussed with you today. Even though you declined this today, please call our office should you change your mind, and we can give you the proper paperwork for you to fill out.  Next Medicare Annual Wellness Visit scheduled for next year: Yes 11/14/2024 @ 2:20pm televisit

## 2023-11-14 NOTE — Progress Notes (Signed)
Subjective:   Christie Lin is a 74 y.o. female who presents for Medicare Annual (Subsequent) preventive examination.  Visit Complete: Virtual I connected with  Christie Lin on 11/14/23 by a audio enabled telemedicine application and verified that I am speaking with the correct person using two identifiers.  Patient Location: Home  Provider Location: Home Office  I discussed the limitations of evaluation and management by telemedicine. The patient expressed understanding and agreed to proceed.  Vital Signs: Because this visit was a virtual/telehealth visit, some criteria may be missing or patient reported. Any vitals not documented were not able to be obtained and vitals that have been documented are patient reported.  Patient Medicare AWV questionnaire was completed by the patient on (not done); I have confirmed that all information answered by patient is correct and no changes since this date.  Cardiac Risk Factors include: advanced age (>86men, >55 women)     Objective:    Today's Vitals   11/14/23 1426 11/14/23 1427  Weight: 125 lb (56.7 kg)   Height: 5\' 6"  (1.676 m)   PainSc:  6    Body mass index is 20.18 kg/m.     11/14/2023    3:01 PM 11/10/2022    1:20 PM 01/24/2022    9:07 AM 01/20/2022    9:03 AM 01/14/2022    2:57 PM 06/14/2020    9:45 PM  Advanced Directives  Does Patient Have a Medical Advance Directive? No No No No No No  Would patient like information on creating a medical advance directive?  Yes (ED - Information included in AVS) No - Patient declined No - Patient declined No - Patient declined     Current Medications (verified) Outpatient Encounter Medications as of 11/14/2023  Medication Sig   citalopram (CELEXA) 20 MG tablet TAKE 1 TABLET BY MOUTH EVERY OTHER DAY.   cyclobenzaprine (FLEXERIL) 10 MG tablet Take 1 tablet (10 mg total) by mouth 3 (three) times daily as needed for muscle spasms. This can make you sleepy.   MELATONIN PO Take 1 tablet  by mouth at bedtime as needed (sleep).   midodrine (PROAMATINE) 10 MG tablet Take 1 tablet (10 mg total) by mouth 3 (three) times daily as needed.   Vibegron (GEMTESA) 75 MG TABS Take 1 tablet (75 mg total) by mouth daily.   No facility-administered encounter medications on file as of 11/14/2023.    Allergies (verified) Hydrocodone-acetaminophen and Penicillins   History: Past Medical History:  Diagnosis Date   Anemia    Blood transfusion without reported diagnosis    DDD (degenerative disc disease), lumbar 06/20/2017   L3-4   Depression    grief reaction   Endometriosis    Fall    fell from broken sidewalk   Hypotension    per pt she has fainted from low b/p in the past   Low back pain    Palpitations 2022   no medications   Scoliosis    Past Surgical History:  Procedure Laterality Date   ABLATION ON ENDOMETRIOSIS     BALLOON DILATION  03/15/2023   Procedure: BALLOON DILATION;  Surgeon: Midge Minium, MD;  Location: [facility] ENDOSCOPY;  Service: Endoscopy;;   CESAREAN SECTION     COLONOSCOPY     COLONOSCOPY WITH PROPOFOL N/A 03/15/2023   Procedure: COLONOSCOPY WITH PROPOFOL;  Surgeon: Midge Minium, MD;  Location: [facility] ENDOSCOPY;  Service: Endoscopy;  Laterality: N/A;   ESOPHAGOGASTRODUODENOSCOPY (EGD) WITH PROPOFOL N/A 03/15/2023   Procedure: ESOPHAGOGASTRODUODENOSCOPY (EGD) WITH  PROPOFOL;  Surgeon: Midge Minium, MD;  Location: [facility] ENDOSCOPY;  Service: Endoscopy;  Laterality: N/A;   POLYPECTOMY  03/15/2023   Procedure: POLYPECTOMY;  Surgeon: Midge Minium, MD;  Location: [facility] ENDOSCOPY;  Service: Endoscopy;;   ruptured ovarian cyst     TOTAL KNEE ARTHROPLASTY Left 01/20/2022   Procedure: TOTAL KNEE ARTHROPLASTY;  Surgeon: Lyndle Herrlich, MD;  Location: [facility] ORS;  Service: Orthopedics;  Laterality: Left;   TUBAL LIGATION     WISDOM TOOTH EXTRACTION     Family History  Problem Relation Age of Onset   Cancer Maternal Grandmother        ovarian   Depression Sister    Colon  cancer Neg Hx    Esophageal cancer Neg Hx    Pancreatic cancer Neg Hx    Rectal cancer Neg Hx    Stomach cancer Neg Hx    Social History   Socioeconomic History   Marital status: Married    Spouse name: Zollie Beckers   Number of children: 3   Years of education: Not on file   Highest education level: Not on file  Occupational History   Occupation: Runner, broadcasting/film/video  Tobacco Use   Smoking status: Never   Smokeless tobacco: Never  Vaping Use   Vaping status: Never Used  Substance and Sexual Activity   Alcohol use: Yes    Alcohol/week: 0.0 standard drinks of alcohol    Comment: rare   Drug use: No   Sexual activity: Not on file  Other Topics Concern   Not on file  Social History Narrative   Not on file   Social Drivers of Health   Financial Resource Strain: Low Risk  (11/14/2023)   Overall Financial Resource Strain (CARDIA)    Difficulty of Paying Living Expenses: Not hard at all  Food Insecurity: No Food Insecurity (11/14/2023)   Hunger Vital Sign    Worried About Running Out of Food in the Last Year: Never true    Ran Out of Food in the Last Year: Never true  Transportation Needs: No Transportation Needs (11/14/2023)   PRAPARE - Administrator, Civil Service (Medical): No    Lack of Transportation (Non-Medical): No  Physical Activity: Sufficiently Active (11/14/2023)   Exercise Vital Sign    Days of Exercise per Week: 4 days    Minutes of Exercise per Session: 60 min  Stress: No Stress Concern Present (11/14/2023)   [employer] of Occupational Health - Occupational Stress Questionnaire    Feeling of Stress : Not at all  Social Connections: Socially Integrated (11/14/2023)   Social Connection and Isolation Panel [NHANES]    Frequency of Communication with Friends and Family: Three times a week    Frequency of Social Gatherings with Friends and Family: Twice a week    Attends Religious Services: More than 4 times per year    Active Member of [employer] or Organizations: Yes     Attends Banker Meetings: 1 to 4 times per year    Marital Status: Married    Tobacco Counseling Counseling given: Not Answered  Clinical Intake:  Pre-visit preparation completed: Yes  Pain : 0-10 Pain Score: 6  Pain Type: Chronic pain Pain Location: Back Pain Orientation: Lower Pain Descriptors / Indicators: Aching Pain Onset: More than a month ago Pain Frequency: Constant Pain Relieving Factors: PT, occassionally Tylenol, muscle relaxer at night  Pain Relieving Factors: PT, occassionally Tylenol, muscle relaxer at night  BMI - recorded: 20.18 Nutritional Status: BMI of  19-24  Normal Nutritional Risks: None Diabetes: No  How often do you need to have someone help you when you read instructions, pamphlets, or other written materials from your doctor or pharmacy?: 1 - Never  Interpreter Needed?: No  Comments: lives with husband Information entered by :: B.Shandricka Monroy,LPN   Activities of Daily Living    11/14/2023    3:01 PM  In your present state of health, do you have any difficulty performing the following activities:  Hearing? 0  Vision? 0  Difficulty concentrating or making decisions? 0  Walking or climbing stairs? 0  Dressing or bathing? 0  Doing errands, shopping? 0  Preparing Food and eating ? N  Using the Toilet? N  In the past six months, have you accidently leaked urine? Y  Do you have problems with loss of bowel control? N  Managing your Medications? N  Managing your Finances? N  Housekeeping or managing your Housekeeping? N    Patient Care Team: Tower, Audrie Gallus, MD as PCP - General  Indicate any recent Medical Services you may have received from other than [facility] providers in the past year (date may be approximate).     Assessment:   This is a routine wellness examination for Christie Lin.  Hearing/Vision screen Hearing Screening - Comments:: Pt says her hearing is perfect Vision Screening - Comments:: Pt says her vision is good  w/glasses [facility]   Goals Addressed   None    Depression Screen    11/14/2023    2:45 PM 03/09/2023   11:36 AM 11/10/2022    2:41 PM 11/01/2022   10:37 AM 08/04/2021   11:19 AM  PHQ 2/9 Scores  PHQ - 2 Score 0 0 0 0 0  PHQ- 9 Score  2 0 0 0    Fall Risk    11/14/2023    2:40 PM 03/09/2023   11:36 AM 11/10/2022    2:39 PM 11/01/2022   10:37 AM  Fall Risk   Falls in the past year? 0 0 0 0  Number falls in past yr: 0 0 0 0  Injury with Fall? 0  0 0  Risk for fall due to : No Fall Risks No Fall Risks No Fall Risks No Fall Risks  Follow up Education provided;Falls prevention discussed Falls evaluation completed Education provided;Falls prevention discussed Falls evaluation completed    MEDICARE RISK AT HOME: Medicare Risk at Home Any stairs in or around the home?: Yes If so, are there any without handrails?: Yes Home free of loose throw rugs in walkways, pet beds, electrical cords, etc?: Yes Adequate lighting in your home to reduce risk of falls?: Yes Life alert?: No Use of a cane, walker or w/c?: No Grab bars in the bathroom?: No Shower chair or bench in shower?: Yes Elevated toilet seat or a handicapped toilet?: Yes  TIMED UP AND GO:  Was the test performed?  No    Cognitive Function:        11/14/2023    3:03 PM 11/10/2022    1:22 PM  6CIT Screen  What Year? 0 points 0 points  What month? 0 points 0 points  What time? 0 points 0 points  Count back from 20 0 points 0 points  Months in reverse 0 points 0 points  Repeat phrase 0 points 0 points  Total Score 0 points 0 points    Immunizations Immunization History  Administered Date(s) Administered   Hepatitis A, Adult 08/29/2015   Influenza,inj,Quad PF,6+ Mos 11/01/2013,  08/29/2015, 08/29/2019   PFIZER(Purple Top)SARS-COV-2 Vaccination 01/12/2021   Td 03/10/2020   Tdap 11/01/2013    TDAP status: Up to date  Flu Vaccine status: Declined, Education has been provided regarding the importance of this  vaccine but patient still declined. Advised may receive this vaccine at local pharmacy or Health Dept. Aware to provide a copy of the vaccination record if obtained from local pharmacy or Health Dept. Verbalized acceptance and understanding.  Pneumococcal vaccine status: Declined,  Education has been provided regarding the importance of this vaccine but patient still declined. Advised may receive this vaccine at local pharmacy or Health Dept. Aware to provide a copy of the vaccination record if obtained from local pharmacy or Health Dept. Verbalized acceptance and understanding.   Covid-19 vaccine status: Completed vaccines  Qualifies for Shingles Vaccine? Yes   Zostavax completed No   Shingrix Completed?: No.    Education has been provided regarding the importance of this vaccine. Patient has been advised to call insurance company to determine out of pocket expense if they have not yet received this vaccine. Advised may also receive vaccine at local pharmacy or Health Dept. Verbalized acceptance and understanding.  Screening Tests Health Maintenance  Topic Date Due   Zoster Vaccines- Shingrix (1 of 2) Never done   Pneumonia Vaccine 38+ Years old (1 of 1 - PCV) Never done   DEXA SCAN  10/25/2020   MAMMOGRAM  01/23/2022   COVID-19 Vaccine (2 - 2024-25 season) 05/29/2023   INFLUENZA VACCINE  12/26/2023 (Originally 04/28/2023)   Medicare Annual Wellness (AWV)  11/13/2024   Colonoscopy  03/14/2028   DTaP/Tdap/Td (3 - Td or Tdap) 03/10/2030   Hepatitis C Screening  Completed   HPV VACCINES  Aged Out    Health Maintenance  Health Maintenance Due  Topic Date Due   Zoster Vaccines- Shingrix (1 of 2) Never done   Pneumonia Vaccine 65+ Years old (1 of 1 - PCV) Never done   DEXA SCAN  10/25/2020   MAMMOGRAM  01/23/2022   COVID-19 Vaccine (2 - 2024-25 season) 05/29/2023    Colorectal cancer screening: Type of screening: Colonoscopy. Completed 03/15/2023. Repeat every 5 years  Mammogram  status: Completed no-ordered. Repeat every year  Bone Density status: Completed no. Results reflect: Bone density results: NORMAL. Repeat every will schedule when returns to [place] years.  Lung Cancer Screening: (Low Dose CT Chest recommended if Age 41-80 years, 20 pack-year currently smoking OR have quit w/in 15years.) does not qualify.   Lung Cancer Screening Referral: no  Additional Screening:  Hepatitis C Screening: does not qualify; Completed 08/29/2015  Vision Screening: Recommended annual ophthalmology exams for early detection of glaucoma and other disorders of the eye. Is the patient up to date with their annual eye exam?  No  Who is the provider or what is the name of the office in which the patient attends annual eye exams? [facility] If pt is not established with a provider, would they like to be referred to a provider to establish care? No .   Dental Screening: Recommended annual dental exams for proper oral hygiene  Diabetic Foot Exam: n/a  Community Resource Referral / Chronic Care Management: CRR required this visit?  No   CCM required this visit?  No pt says she is out of state [facility]) with her husband treatments. She will schedule when back    Plan:     I have personally reviewed and noted the following in the patient's chart:   Medical  and social history Use of alcohol, tobacco or illicit drugs  Current medications and supplements including opioid prescriptions. Patient is not currently taking opioid prescriptions. Functional ability and status Nutritional status Physical activity Advanced directives List of other physicians Hospitalizations, surgeries, and ER visits in previous 12 months Vitals Screenings to include cognitive, depression, and falls Referrals and appointments  In addition, I have reviewed and discussed with patient certain preventive protocols, quality metrics, and best practice recommendations. A written personalized care plan for  preventive services as well as general preventive health recommendations were provided to patient.     Sue Lush, LPN   02/29/5408   After Visit Summary: (MyChart) Due to this being a telephonic visit, the after visit summary with patients personalized plan was offered to patient via MyChart   Nurse Notes: The patient states she is doing well. She has been in [place] with her husband (being treated in trials for Lung Ca) and has no concerns or questions at this time.

## 2023-12-07 ENCOUNTER — Other Ambulatory Visit: Payer: Self-pay | Admitting: Orthopedic Surgery

## 2023-12-07 DIAGNOSIS — M47816 Spondylosis without myelopathy or radiculopathy, lumbar region: Secondary | ICD-10-CM

## 2023-12-07 DIAGNOSIS — M5136 Other intervertebral disc degeneration, lumbar region with discogenic back pain only: Secondary | ICD-10-CM

## 2024-01-06 DIAGNOSIS — H5713 Ocular pain, bilateral: Secondary | ICD-10-CM | POA: Diagnosis not present

## 2024-02-17 ENCOUNTER — Ambulatory Visit (INDEPENDENT_AMBULATORY_CARE_PROVIDER_SITE_OTHER): Admitting: Family Medicine

## 2024-02-17 VITALS — BP 124/80 | HR 82 | Temp 98.2°F | Ht 63.75 in | Wt 128.2 lb

## 2024-02-17 DIAGNOSIS — K219 Gastro-esophageal reflux disease without esophagitis: Secondary | ICD-10-CM | POA: Diagnosis not present

## 2024-02-17 DIAGNOSIS — Z0000 Encounter for general adult medical examination without abnormal findings: Secondary | ICD-10-CM | POA: Diagnosis not present

## 2024-02-17 DIAGNOSIS — H6120 Impacted cerumen, unspecified ear: Secondary | ICD-10-CM | POA: Insufficient documentation

## 2024-02-17 DIAGNOSIS — R6889 Other general symptoms and signs: Secondary | ICD-10-CM | POA: Insufficient documentation

## 2024-02-17 DIAGNOSIS — E2839 Other primary ovarian failure: Secondary | ICD-10-CM | POA: Diagnosis not present

## 2024-02-17 DIAGNOSIS — Z860100 Personal history of colon polyps, unspecified: Secondary | ICD-10-CM | POA: Diagnosis not present

## 2024-02-17 DIAGNOSIS — I951 Orthostatic hypotension: Secondary | ICD-10-CM

## 2024-02-17 DIAGNOSIS — R232 Flushing: Secondary | ICD-10-CM

## 2024-02-17 DIAGNOSIS — F418 Other specified anxiety disorders: Secondary | ICD-10-CM | POA: Diagnosis not present

## 2024-02-17 DIAGNOSIS — J301 Allergic rhinitis due to pollen: Secondary | ICD-10-CM

## 2024-02-17 DIAGNOSIS — M8580 Other specified disorders of bone density and structure, unspecified site: Secondary | ICD-10-CM

## 2024-02-17 DIAGNOSIS — J309 Allergic rhinitis, unspecified: Secondary | ICD-10-CM | POA: Insufficient documentation

## 2024-02-17 DIAGNOSIS — H6121 Impacted cerumen, right ear: Secondary | ICD-10-CM

## 2024-02-17 DIAGNOSIS — Z1211 Encounter for screening for malignant neoplasm of colon: Secondary | ICD-10-CM

## 2024-02-17 DIAGNOSIS — Z13220 Encounter for screening for lipoid disorders: Secondary | ICD-10-CM | POA: Diagnosis not present

## 2024-02-17 DIAGNOSIS — R55 Syncope and collapse: Secondary | ICD-10-CM

## 2024-02-17 DIAGNOSIS — Z1231 Encounter for screening mammogram for malignant neoplasm of breast: Secondary | ICD-10-CM | POA: Insufficient documentation

## 2024-02-17 LAB — CBC WITH DIFFERENTIAL/PLATELET
Basophils Absolute: 0 10*3/uL (ref 0.0–0.1)
Basophils Relative: 0.7 % (ref 0.0–3.0)
Eosinophils Absolute: 0.1 10*3/uL (ref 0.0–0.7)
Eosinophils Relative: 2 % (ref 0.0–5.0)
HCT: 40.1 % (ref 36.0–46.0)
Hemoglobin: 13.5 g/dL (ref 12.0–15.0)
Lymphocytes Relative: 25.6 % (ref 12.0–46.0)
Lymphs Abs: 1.2 10*3/uL (ref 0.7–4.0)
MCHC: 33.7 g/dL (ref 30.0–36.0)
MCV: 91.2 fl (ref 78.0–100.0)
Monocytes Absolute: 0.4 10*3/uL (ref 0.1–1.0)
Monocytes Relative: 8.4 % (ref 3.0–12.0)
Neutro Abs: 2.9 10*3/uL (ref 1.4–7.7)
Neutrophils Relative %: 63.3 % (ref 43.0–77.0)
Platelets: 212 10*3/uL (ref 150.0–400.0)
RBC: 4.39 Mil/uL (ref 3.87–5.11)
RDW: 13.6 % (ref 11.5–15.5)
WBC: 4.5 10*3/uL (ref 4.0–10.5)

## 2024-02-17 LAB — COMPREHENSIVE METABOLIC PANEL WITH GFR
ALT: 9 U/L (ref 0–35)
AST: 22 U/L (ref 0–37)
Albumin: 4.5 g/dL (ref 3.5–5.2)
Alkaline Phosphatase: 52 U/L (ref 39–117)
BUN: 15 mg/dL (ref 6–23)
CO2: 33 meq/L — ABNORMAL HIGH (ref 19–32)
Calcium: 9.8 mg/dL (ref 8.4–10.5)
Chloride: 102 meq/L (ref 96–112)
Creatinine, Ser: 0.78 mg/dL (ref 0.40–1.20)
GFR: 76.24 mL/min (ref >60.00)
Glucose, Bld: 84 mg/dL (ref 70–99)
Potassium: 4.6 meq/L (ref 3.5–5.1)
Sodium: 141 meq/L (ref 135–145)
Total Bilirubin: 0.4 mg/dL (ref 0.2–1.2)
Total Protein: 7 g/dL (ref 6.0–8.3)

## 2024-02-17 LAB — LIPID PANEL
Cholesterol: 214 mg/dL — ABNORMAL HIGH (ref 0–200)
HDL: 59.5 mg/dL (ref >39.00)
LDL Cholesterol: 134 mg/dL — ABNORMAL HIGH (ref 0–99)
NonHDL: 154.54
Total CHOL/HDL Ratio: 4
Triglycerides: 103 mg/dL (ref 0.0–149.0)
VLDL: 20.6 mg/dL (ref 0.0–40.0)

## 2024-02-17 LAB — LUTEINIZING HORMONE: LH: 50.26 m[IU]/mL

## 2024-02-17 LAB — FOLLICLE STIMULATING HORMONE: FSH: 87.2 m[IU]/mL

## 2024-02-17 LAB — TSH: TSH: 1.22 u[IU]/mL (ref 0.35–5.50)

## 2024-02-17 NOTE — Progress Notes (Signed)
 Subjective:    Patient ID: Christie Lin, female    DOB: [birth year], 73 y.o.   MRN: [id number]  HPI  Here for health maintenance exam and to review chronic medical problems   Wt Readings from Last 3 Encounters:  02/17/24 128 lb 4 oz (58.2 kg)  11/14/23 125 lb (56.7 kg)  07/20/23 125 lb (56.7 kg)   22.19 kg/m  Vitals:   02/17/24 1101  BP: 124/80  Pulse: 82  Temp: 98.2 F (36.8 C)  SpO2: 99%    Immunization History  Administered Date(s) Administered   Hepatitis A, Adult 03/13/2007, 08/29/2015   Influenza,inj,Quad PF,6+ Mos 11/01/2013, 08/29/2015, 08/29/2019   PFIZER Comirnaty(Gray Top)Covid-19 Tri-Sucrose Vaccine 02/02/2021   PFIZER(Purple Top)SARS-COV-2 Vaccination 01/12/2021   Td 03/10/2020   Tdap 03/13/2007, 11/01/2013   Typhoid Live 03/13/2007    Health Maintenance Due  Topic Date Due   DEXA SCAN  10/25/2020   MAMMOGRAM  01/23/2022     Pna vaccine -declines  Shingrix vaccine -declines     Mammogram 12/2019- needs one  [place]  Self breast exam- no lumps   Gyn health No problems    Colon cancer screening  colonoscopy 02/2023 with 5 y recall    Bone health  Dexa 09/2018 osteopenia  Falls- none  Fractures-none  Supplements -takes mvi    Exercise :  Riding a stationary bike - really likes it  Has bands and weights - not using     Mood    02/17/2024   11:05 AM 11/14/2023    2:45 PM 03/09/2023   11:36 AM 11/10/2022    2:41 PM 11/01/2022   10:37 AM  Depression screen PHQ 2/9  Decreased Interest 0 0 0 0 0  Down, Depressed, Hopeless 0 0 0 0 0  PHQ - 2 Score 0 0 0 0 0  Altered sleeping 0  2 0 0  Tired, decreased energy 0  0 0 0  Change in appetite 0  0 0 0  Feeling bad or failure about yourself  0  0 0 0  Trouble concentrating 0  0 0 0  Moving slowly or fidgety/restless 0  0 0 0  Suicidal thoughts 0  0 0 0  PHQ-9 Score 0  2 0 0  Difficult doing work/chores Not difficult at all  Not difficult at all Not difficult at all Not difficult at  all   Celexa  20 mg daily for history of anxious depression  Takes it 1 pill every 3 or 4 days   Stress-husband with lung cancer     GERD prev on ppi and H2 Normal EGD 02/2023  No med needed not   Overactive bladder and mixed incontinence  Uses gemtesa  75 mg daily from urology   Near syncope Still gets dizzy occational Has had bouts of vertigo in the past  Occational brief episode when sitting - several times per week   Does take minodrine for low blood pressure as needed  BP Readings from Last 3 Encounters:  02/17/24 124/80  07/20/23 114/74  06/06/23 115/74   Pulse Readings from Last 3 Encounters:  02/17/24 82  05/05/23 76  03/15/23 71    New problems  In [place] for 3 months  Would wake up with hot sweats and flushing /and occational during the day  Some sweats Has had hot flash   Home a week      Patient Active Problem List   Diagnosis Date Noted   Routine general medical examination at a  health care facility 02/18/2024   Osteopenia 02/18/2024   Encounter for screening mammogram for breast cancer 02/17/2024   Estrogen deficiency 02/17/2024   Screening for lipoid disorders 02/17/2024   Allergic rhinitis 02/17/2024   Cerumen impaction 02/17/2024   Flushing 02/17/2024   Heat intolerance 02/17/2024   Hot flashes 02/17/2024   History of colonic polyps 03/15/2023   Polyp of ascending colon 03/15/2023   Overactive bladder 03/09/2023   Mixed incontinence 03/09/2023   GERD (gastroesophageal reflux disease) 10/15/2022   Dysphagia 10/15/2022   S/P TKR (total knee replacement) using cement, left 01/20/2022   Knee pain, bilateral 08/04/2021   Palpitations 02/02/2021   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   Low back pain potentially associated with radiculopathy 06/20/2017   Pulmonary nodules 11/21/2015   Colon cancer screening 11/21/2015   Mid back pain 08/13/2013   Baker's cyst of knee 08/13/2013   Near syncope 10/12/2012   Hypotension 09/25/2012   Past Medical  History:  Diagnosis Date   Anemia    Blood transfusion without reported diagnosis    DDD (degenerative disc disease), lumbar 06/20/2017   L3-4   Depression    grief reaction   Endometriosis    Fall    fell from broken sidewalk   Hypotension    per pt she has fainted from low b/p in the past   Low back pain    Palpitations 2022   no medications   Scoliosis    Past Surgical History:  Procedure Laterality Date   ABLATION ON ENDOMETRIOSIS     BALLOON DILATION  03/15/2023   Procedure: BALLOON DILATION;  Surgeon: Marnee Sink, MD;  Location: [facility] ENDOSCOPY;  Service: Endoscopy;;   CESAREAN SECTION     COLONOSCOPY     COLONOSCOPY WITH PROPOFOL  N/A 03/15/2023   Procedure: COLONOSCOPY WITH PROPOFOL ;  Surgeon: Marnee Sink, MD;  Location: [facility] ENDOSCOPY;  Service: Endoscopy;  Laterality: N/A;   ESOPHAGOGASTRODUODENOSCOPY (EGD) WITH PROPOFOL  N/A 03/15/2023   Procedure: ESOPHAGOGASTRODUODENOSCOPY (EGD) WITH PROPOFOL ;  Surgeon: Marnee Sink, MD;  Location: [facility] ENDOSCOPY;  Service: Endoscopy;  Laterality: N/A;   POLYPECTOMY  03/15/2023   Procedure: POLYPECTOMY;  Surgeon: Marnee Sink, MD;  Location: [facility] ENDOSCOPY;  Service: Endoscopy;;   ruptured ovarian cyst     TOTAL KNEE ARTHROPLASTY Left 01/20/2022   Procedure: TOTAL KNEE ARTHROPLASTY;  Surgeon: Jerlyn Moons, MD;  Location: [facility] ORS;  Service: Orthopedics;  Laterality: Left;   TUBAL LIGATION     WISDOM TOOTH EXTRACTION     Social History   Tobacco Use   Smoking status: Never   Smokeless tobacco: Never  Vaping Use   Vaping status: Never Used  Substance Use Topics   Alcohol use: Yes    Alcohol/week: 0.0 standard drinks of alcohol    Comment: rare   Drug use: No   Family History  Problem Relation Age of Onset   Cancer Maternal Grandmother        ovarian   Depression Sister    Colon cancer Neg Hx    Esophageal cancer Neg Hx    Pancreatic cancer Neg Hx    Rectal cancer Neg Hx    Stomach cancer Neg Hx    Allergies   Allergen Reactions   Hydrocodone -Acetaminophen  Other (See Comments)    Drop BP   Penicillins Swelling and Rash    Throat swelling, rash   Current Outpatient Medications on File Prior to Visit  Medication Sig Dispense Refill   citalopram  (CELEXA ) 20 MG tablet TAKE 1 TABLET BY MOUTH  EVERY OTHER DAY. 45 tablet 3   cyclobenzaprine  (FLEXERIL ) 10 MG tablet TAKE ONE TABLET BY MOUTH 3 TIMES DAILY AS NEEDED FOR MUSCLE SPASM. THIS CAN MAKE YOU SLEEPY 60 tablet 0   MELATONIN PO Take 1 tablet by mouth at bedtime as needed (sleep).     midodrine  (PROAMATINE ) 10 MG tablet Take 1 tablet (10 mg total) by mouth 3 (three) times daily as needed. 90 tablet 2   Vibegron  (GEMTESA ) 75 MG TABS Take 1 tablet (75 mg total) by mouth daily. 90 tablet 3   No current facility-administered medications on file prior to visit.    Review of Systems  Constitutional:  Negative for activity change, appetite change, fatigue, fever and unexpected weight change.  HENT:  Negative for congestion, ear pain, rhinorrhea, sinus pressure and sore throat.   Eyes:  Negative for pain, redness and visual disturbance.  Respiratory:  Negative for cough, shortness of breath and wheezing.   Cardiovascular:  Negative for chest pain and palpitations.  Gastrointestinal:  Negative for abdominal pain, blood in stool, constipation and diarrhea.  Endocrine: Positive for heat intolerance (some flushing/hot flases and night sweats). Negative for polydipsia and polyuria.  Genitourinary:  Negative for dysuria, frequency and urgency.  Musculoskeletal:  Negative for arthralgias, back pain and myalgias.  Skin:  Negative for pallor and rash.  Allergic/Immunologic: Negative for environmental allergies.  Neurological:  Negative for dizziness, syncope and headaches.  Hematological:  Negative for adenopathy. Does not bruise/bleed easily.  Psychiatric/Behavioral:  Negative for decreased concentration and dysphoric mood. The patient is not nervous/anxious.         Objective:   Physical Exam Constitutional:      General: She is not in acute distress.    Appearance: Normal appearance. She is well-developed. She is not ill-appearing or diaphoretic.  HENT:     Head: Normocephalic and atraumatic.     Right Ear: Tympanic membrane, ear canal and external ear normal.     Left Ear: Tympanic membrane, ear canal and external ear normal.     Nose: Nose normal. No congestion.     Mouth/Throat:     Mouth: Mucous membranes are moist.     Pharynx: Oropharynx is clear. No posterior oropharyngeal erythema.  Eyes:     General: No scleral icterus.    Extraocular Movements: Extraocular movements intact.     Conjunctiva/sclera: Conjunctivae normal.     Pupils: Pupils are equal, round, and reactive to light.  Neck:     Thyroid : No thyromegaly.     Vascular: No carotid bruit or JVD.  Cardiovascular:     Rate and Rhythm: Normal rate and regular rhythm.     Pulses: Normal pulses.     Heart sounds: Normal heart sounds.     No gallop.  Pulmonary:     Effort: Pulmonary effort is normal. No respiratory distress.     Breath sounds: Normal breath sounds. No wheezing.     Comments: Good air exch Chest:     Chest wall: No tenderness.  Abdominal:     General: Bowel sounds are normal. There is no distension or abdominal bruit.     Palpations: Abdomen is soft. There is no mass.     Tenderness: There is no abdominal tenderness.     Hernia: No hernia is present.  Genitourinary:    Comments: Breast exam: No mass, nodules, thickening, tenderness, bulging, retraction, inflamation, nipple discharge or skin changes noted.  No axillary or clavicular LA.     Musculoskeletal:  General: No tenderness. Normal range of motion.     Cervical back: Normal range of motion and neck supple. No rigidity. No muscular tenderness.     Right lower leg: No edema.     Left lower leg: No edema.     Comments: No kyphosis   Lymphadenopathy:     Cervical: No cervical adenopathy.   Skin:    General: Skin is warm and dry.     Coloration: Skin is not pale.     Findings: No erythema or rash.     Comments: Solar lentigines diffusely   Neurological:     Mental Status: She is alert. Mental status is at baseline.     Cranial Nerves: No cranial nerve deficit.     Motor: No abnormal muscle tone.     Coordination: Coordination normal.     Gait: Gait normal.     Deep Tendon Reflexes: Reflexes are normal and symmetric. Reflexes normal.  Psychiatric:        Mood and Affect: Mood normal.        Cognition and Memory: Cognition and memory normal.           Assessment & Plan:   Problem List Items Addressed This Visit       Cardiovascular and Mediastinum   Near syncope   Associated with vertiginous symptoms occational  Less often than in the past Labs today  Reassuring exam and vitals       Relevant Orders   CBC with Differential/Platelet (Completed)   Comprehensive metabolic panel with GFR (Completed)   TSH (Completed)   Hypotension   Less often now  Continue to watch this Normal blood pressure and pulse        Relevant Orders   CBC with Differential/Platelet (Completed)   Comprehensive metabolic panel with GFR (Completed)   TSH (Completed)   Hot flashes   Unsure if hormonal or from coming off citalopram   FSH and LH added to labs today       Relevant Orders   Follicle Stimulating Hormone (Completed)   Luteinizing hormone (Completed)   Flushing   Unsure if hormonal or from weaning citalopram   Added FSH and LH today       Relevant Orders   CBC with Differential/Platelet (Completed)   Comprehensive metabolic panel with GFR (Completed)   TSH (Completed)     Digestive   GERD (gastroesophageal reflux disease)   Doing better No longer on medication [place] EGD 02/2023         Nervous and Auditory   Cerumen impaction   Worse in right ear Encouraged use of debrox Can return for irrigation          Musculoskeletal and Integument    Osteopenia   Dexa ordered  No falls or fractures Discussed fall prevention, supplements and exercise for bone density          Other   Screening for lipoid disorders   Lipids goday  Disc goals for lipids and reasons to control them Rev last labs with pt Rev low sat fat diet in detail Overall good diet       Relevant Orders   Lipid Panel (Completed)   Routine general medical examination at a health care facility - Primary   Reviewed health habits including diet and exercise and skin cancer prevention Reviewed appropriate screening tests for age  Also reviewed health mt list, fam hx and immunization status , as well as social and family history   See HPI  Labs reviewed and ordered Health Maintenance  Topic Date Due   DEXA scan (bone density measurement)  10/25/2020   Mammogram  01/23/2022   Pneumonia Vaccine (1 of 1 - PCV) 02/16/2025*   COVID-19 Vaccine (3 - 2024-25 season) 03/04/2025*   Zoster (Shingles) Vaccine (1 of 2) 05/19/2025*   Flu Shot  04/27/2024   Medicare Annual Wellness Visit  11/13/2024   Colon Cancer Screening  03/14/2028   DTaP/Tdap/Td vaccine (4 - Td or Tdap) 03/10/2030   Hepatitis C Screening  Completed   HPV Vaccine  Aged Out   Meningitis B Vaccine  Aged Out  *Topic was postponed. The date shown is not the original due date.    Mammogram and dexa ordered Discussed fall prevention, supplements and exercise for bone density  PHQ 0       History of colonic polyps   Colonoscopy 02/2023 with 5 y recall      Heat intolerance   FSH/ LH today   ? If has to do with decreasing citalopram  Labs pending Normal exam       Relevant Orders   CBC with Differential/Platelet (Completed)   Comprehensive metabolic panel with GFR (Completed)   TSH (Completed)   Estrogen deficiency   Dexa ordered       Relevant Orders   DG Bone Density   Encounter for screening mammogram for breast cancer   Mammogram ordered  Pt will call to schedule       Relevant  Orders   MM 3D SCREENING MAMMOGRAM BILATERAL BREAST   Colon cancer screening   Colonoscopy 02/2023 with 5 year recall       Anxious depression   Pt is doing better and taking her citalopram  several days per week  Doubt needed at this time  This method of dosing likely not doing much  Also increase in flushing and night sweats/hot flashes- ? From coming off med  PHQ 0  Overall doing well   Recommend either returning to daily or stopping medication Encouraged good self care

## 2024-02-17 NOTE — Patient Instructions (Addendum)
 For bone health  Continue multi vitamin  Add another 2000 international units vitamin D3 daily   Stay active  Keep pedaling  Add some strength/ resistance training  Add some strength training to your routine, this is important for bone and brain health and can reduce your risk of falls and help your body use insulin properly and regulate weight  Light weights, exercise bands , and internet videos are a good way to start  Yoga (chair or regular), machines , floor exercises or a gym with machines are also good options    If you are doing well -you may not need the citalopram    You have some ear wax worse on the right  Get debrox solution over the counter and use it twice a week as directed  We we may need to flush your ears out in 1-2 months   Also flonase nasal spray over the counter may help allergies and dizziness   Labs today   Coming on and off citalopram  could cause some of your hot flashes      You have an order for:  [x]   3D Mammogram  [x]   Bone Density     Please call for appointment:   [x]   [facility]  [address] [place] [address]  [phone]  []   [facility])   [address]  [place], [place] [address]  [phone]  []   [facility]      [address] [place], [place]        [phone]         []   [facility]  [address] [place], [place]  [phone]  []  [facility]   [address]   [place], [place] [address]  [phone]  []  [facility]  [address] [place], [place] [address] [phone]    Make sure to wear two piece clothing  No lotions powders or deodorants the day of the appointment Make sure to bring picture ID and insurance card.  Bring list of medications you are  currently taking including any supplements.   Schedule your screening mammogram through MyChart!   Select [place] imaging sites can now be scheduled through MyChart.  Log into your MyChart account.  Go to 'Visit' (or 'Appointments' if  on mobile App) --> Schedule an  Appointment  Under 'Select a Reason for Visit' choose the Mammogram  Screening option.  Complete the pre-visit questions  and select the time and place that  best fits your schedule

## 2024-02-18 ENCOUNTER — Ambulatory Visit: Payer: Self-pay | Admitting: Family Medicine

## 2024-02-18 ENCOUNTER — Encounter: Payer: Self-pay | Admitting: Family Medicine

## 2024-02-18 DIAGNOSIS — Z0000 Encounter for general adult medical examination without abnormal findings: Secondary | ICD-10-CM | POA: Insufficient documentation

## 2024-02-18 DIAGNOSIS — M8580 Other specified disorders of bone density and structure, unspecified site: Secondary | ICD-10-CM | POA: Insufficient documentation

## 2024-02-18 NOTE — Assessment & Plan Note (Signed)
 Lipids goday  Disc goals for lipids and reasons to control them Rev last labs with pt Rev low sat fat diet in detail Overall good diet

## 2024-02-18 NOTE — Assessment & Plan Note (Signed)
 Unsure if hormonal or from coming off citalopram   FSH and LH added to labs today

## 2024-02-18 NOTE — Assessment & Plan Note (Signed)
 FSH/ LH today   ? If has to do with decreasing citalopram  Labs pending Normal exam

## 2024-02-18 NOTE — Assessment & Plan Note (Signed)
 Colonoscopy 02/2023 with 5 year recall

## 2024-02-18 NOTE — Assessment & Plan Note (Signed)
 Worse in right ear Encouraged use of debrox Can return for irrigation

## 2024-02-18 NOTE — Assessment & Plan Note (Signed)
 Doing better No longer on medication [place] EGD 02/2023

## 2024-02-18 NOTE — Assessment & Plan Note (Signed)
 Less often now  Continue to watch this Normal blood pressure and pulse

## 2024-02-18 NOTE — Assessment & Plan Note (Signed)
 Reviewed health habits including diet and exercise and skin cancer prevention Reviewed appropriate screening tests for age  Also reviewed health mt list, fam hx and immunization status , as well as social and family history   See HPI Labs reviewed and ordered Health Maintenance  Topic Date Due   DEXA scan (bone density measurement)  10/25/2020   Mammogram  01/23/2022   Pneumonia Vaccine (1 of 1 - PCV) 02/16/2025*   COVID-19 Vaccine (3 - 2024-25 season) 03/04/2025*   Zoster (Shingles) Vaccine (1 of 2) 05/19/2025*   Flu Shot  04/27/2024   Medicare Annual Wellness Visit  11/13/2024   Colon Cancer Screening  03/14/2028   DTaP/Tdap/Td vaccine (4 - Td or Tdap) 03/10/2030   Hepatitis C Screening  Completed   HPV Vaccine  Aged Out   Meningitis B Vaccine  Aged Out  *Topic was postponed. The date shown is not the original due date.    Mammogram and dexa ordered Discussed fall prevention, supplements and exercise for bone density  PHQ 0

## 2024-02-18 NOTE — Assessment & Plan Note (Signed)
 Dexa ordered

## 2024-02-18 NOTE — Assessment & Plan Note (Signed)
 Unsure if hormonal or from weaning citalopram   Added FSH and LH today

## 2024-02-18 NOTE — Assessment & Plan Note (Signed)
Mammogram ordered °Pt will call to schedule  °

## 2024-02-18 NOTE — Assessment & Plan Note (Signed)
 Pt is doing better and taking her citalopram  several days per week  Doubt needed at this time  This method of dosing likely not doing much  Also increase in flushing and night sweats/hot flashes- ? From coming off med  PHQ 0  Overall doing well   Recommend either returning to daily or stopping medication Encouraged good self care

## 2024-02-18 NOTE — Assessment & Plan Note (Signed)
 Associated with vertiginous symptoms occational  Less often than in the past Labs today  Reassuring exam and vitals

## 2024-02-18 NOTE — Assessment & Plan Note (Signed)
 Colonoscopy 02/2023 with 5 y recall

## 2024-02-18 NOTE — Assessment & Plan Note (Signed)
 Dexa ordered  No falls or fractures  Discussed fall prevention, supplements and exercise for bone density

## 2024-03-09 ENCOUNTER — Encounter: Payer: Self-pay | Admitting: Family Medicine

## 2024-03-09 ENCOUNTER — Ambulatory Visit (INDEPENDENT_AMBULATORY_CARE_PROVIDER_SITE_OTHER): Admitting: Family Medicine

## 2024-03-09 VITALS — BP 118/84 | HR 105 | Temp 98.6°F | Ht 63.75 in | Wt 126.2 lb

## 2024-03-09 DIAGNOSIS — J22 Unspecified acute lower respiratory infection: Secondary | ICD-10-CM | POA: Diagnosis not present

## 2024-03-09 DIAGNOSIS — R051 Acute cough: Secondary | ICD-10-CM

## 2024-03-09 LAB — POC COVID19 BINAXNOW: SARS Coronavirus 2 Ag: NEGATIVE

## 2024-03-09 MED ORDER — AZITHROMYCIN 250 MG PO TABS: ORAL_TABLET | ORAL | 0 refills | Status: DC

## 2024-03-09 MED ORDER — BENZONATATE 100 MG PO CAPS: 100.0000 mg | ORAL_CAPSULE | Freq: Three times a day (TID) | ORAL | 0 refills | Status: DC | PRN

## 2024-03-09 NOTE — Progress Notes (Signed)
 Ph: [phone] Fax: [phone]   Patient ID: Christie Lin, female    DOB: 1951/03/01, 73 y.o.   MRN: [id number]  This visit was conducted in person.  BP 118/84   Pulse (!) 105   Temp 98.6 F (37 C) (Oral)   Ht 5' 3.75 (1.619 m)   Wt 126 lb 4 oz (57.3 kg)   SpO2 98%   BMI 21.84 kg/m    CC: cough, congestion Subjective:   HPI: Christie Lin is a 73 y.o. female presenting on 03/09/2024 for Cough (C/o cough, congestion and fever- max 99.3. Sxs started 03/07/24.)   Pleasant patient with h/o LBP, GERD, osteopenia and hypotension on midodrine  10mg  TID PRN (none in the past 3 months) presents with 2d h/o malaise, fatigue, cough with congestion productive of yellow mucous. Cough affecting sleep. Has felt feverish. + Chills, R earache, PNDrainage. Head and chest congestion. Coughing fits.   No tooth pain, ST, PNdrainage, abd pain, nausea/vomiting, diarrhea, new rashes. No wheezing, chest pain or dyspnea. No increased rhinorrhea.   She just flew home from [place]. Husband is patient at [facility]  for stage IV lung cancer - she's been staying in hospital. Prior to this she was in [place]  - has been on the go for the past 2 weeks.   No sick contacts at home.  No h/o asthma, COPD. Non smoker.    PCN allergy - rash, throat swelling - has tolerated keflex ok     Relevant past medical, surgical, family and social history reviewed and updated as indicated. Interim medical history since our last visit reviewed. Allergies and medications reviewed and updated. Outpatient Medications Prior to Visit  Medication Sig Dispense Refill   citalopram  (CELEXA ) 20 MG tablet TAKE 1 TABLET BY MOUTH EVERY OTHER DAY. 45 tablet 3   cyclobenzaprine  (FLEXERIL ) 10 MG tablet TAKE ONE TABLET BY MOUTH 3 TIMES DAILY AS NEEDED FOR MUSCLE SPASM. THIS CAN MAKE YOU SLEEPY 60 tablet 0   MELATONIN PO Take 1 tablet by mouth at bedtime as needed (sleep).     midodrine  (PROAMATINE ) 10 MG tablet Take 1 tablet  (10 mg total) by mouth 3 (three) times daily as needed. 90 tablet 2   Vibegron  (GEMTESA ) 75 MG TABS Take 1 tablet (75 mg total) by mouth daily. 90 tablet 3   No facility-administered medications prior to visit.     Per HPI unless specifically indicated in ROS section below Review of Systems  Objective:  BP 118/84   Pulse (!) 105   Temp 98.6 F (37 C) (Oral)   Ht 5' 3.75 (1.619 m)   Wt 126 lb 4 oz (57.3 kg)   SpO2 98%   BMI 21.84 kg/m   Wt Readings from Last 3 Encounters:  03/09/24 126 lb 4 oz (57.3 kg)  02/17/24 128 lb 4 oz (58.2 kg)  11/14/23 125 lb (56.7 kg)      Physical Exam Vitals and nursing note reviewed.  Constitutional:      Appearance: Normal appearance. She is not ill-appearing.  HENT:     Head: Normocephalic and atraumatic.     Right Ear: Tympanic membrane, ear canal and external ear normal. There is no impacted cerumen.     Left Ear: Tympanic membrane, ear canal and external ear normal. There is no impacted cerumen.     Nose: Nose normal. No congestion or rhinorrhea.     Mouth/Throat:     Mouth: Mucous membranes are moist.     Pharynx:  Oropharynx is clear. No oropharyngeal exudate or posterior oropharyngeal erythema.   Eyes:     Extraocular Movements: Extraocular movements intact.     Conjunctiva/sclera: Conjunctivae normal.     Pupils: Pupils are equal, round, and reactive to light.    Cardiovascular:     Rate and Rhythm: Normal rate and regular rhythm.     Pulses: Normal pulses.     Heart sounds: Normal heart sounds. No murmur heard. Pulmonary:     Effort: Pulmonary effort is normal. No respiratory distress.     Breath sounds: Normal breath sounds. No wheezing, rhonchi or rales.     Comments: Lungs overall clear Lymphadenopathy:     Head:     Right side of head: No submental, submandibular, tonsillar, preauricular or posterior auricular adenopathy.     Left side of head: No submental, submandibular, tonsillar, preauricular or posterior auricular  adenopathy.     Cervical: No cervical adenopathy.     Right cervical: No superficial cervical adenopathy.    Left cervical: No superficial cervical adenopathy.     Upper Body:     Right upper body: No supraclavicular adenopathy.     Left upper body: No supraclavicular adenopathy.   Skin:    Findings: No rash.   Neurological:     Mental Status: She is alert.   Psychiatric:        Mood and Affect: Mood normal.        Behavior: Behavior normal.       Results for orders placed or performed in visit on 03/09/24  POC COVID-19 BinaxNow   Collection Time: 03/09/24 10:29 AM  Result Value Ref Range   SARS Coronavirus 2 Ag Negative Negative    Assessment & Plan:   Problem List Items Addressed This Visit     Acute respiratory infection - Primary   Anticipate acute bronchitis. Initially O2 sat 92% on presentation, this improves to 99% on recheck.  Lungs clear on exam - ok for outpatient management.  Given recent travel and severity of symptoms, and ill husband at home, will cover for bacterial/atypical bronchitis with azithromycin . Rx tessalon perls for cough.  Discussed OTC cough syrup use as well.  Update if not improving with treatment, low threshold to check CXR.  COVID test negative in office today.       Relevant Medications   azithromycin  (ZITHROMAX ) 250 MG tablet   Other Visit Diagnoses       Acute cough       Relevant Orders   POC COVID-19 BinaxNow (Completed)        Meds ordered this encounter  Medications   azithromycin  (ZITHROMAX ) 250 MG tablet    Sig: Take two tablets on day one followed by one tablet on days 2-5    Dispense:  6 each    Refill:  0   benzonatate (TESSALON) 100 MG capsule    Sig: Take 1 capsule (100 mg total) by mouth 3 (three) times daily as needed for cough.    Dispense:  30 capsule    Refill:  0    Orders Placed This Encounter  Procedures   POC COVID-19 BinaxNow    Patient Instructions  You likely have a bronchitis.  Take  azithromycin  antibiotic sent to pharmacy  Use medication as prescribed: tessalon perls May use over the counter cough syrup like delsym or robitussin.  Push fluids and plenty of rest.  Please return if you are not improving as expected, or if you have high fevers (>  101.5) or difficulty swallowing or worsening productive cough. Call clinic with questions.  Good to see you today. I hope you start feeling better soon.   Follow up plan: Return if symptoms worsen or fail to improve.  Claire Crick, MD

## 2024-03-09 NOTE — Assessment & Plan Note (Addendum)
 Anticipate acute bronchitis. Initially O2 sat 92% on presentation, this improves to 99% on recheck.  Lungs clear on exam - ok for outpatient management.  Given recent travel and severity of symptoms, and ill husband at home, will cover for bacterial/atypical bronchitis with azithromycin . Rx tessalon perls for cough.  Discussed OTC cough syrup use as well.  Update if not improving with treatment, low threshold to check CXR.  COVID test negative in office today.

## 2024-03-09 NOTE — Patient Instructions (Addendum)
 You likely have a bronchitis.  Take azithromycin  antibiotic sent to pharmacy  Use medication as prescribed: tessalon perls May use over the counter cough syrup like delsym or robitussin.  Push fluids and plenty of rest.  Please return if you are not improving as expected, or if you have high fevers (>101.5) or difficulty swallowing or worsening productive cough. Call clinic with questions.  Good to see you today. I hope you start feeling better soon.

## 2024-03-12 ENCOUNTER — Inpatient Hospital Stay
Admission: RE | Admit: 2024-03-12 | Discharge: 2024-03-12 | Disposition: A | Discharge status: Home / Self Care | Payer: Self-pay | Source: Ambulatory Visit | Attending: Family Medicine | Admitting: Family Medicine

## 2024-03-12 ENCOUNTER — Other Ambulatory Visit: Payer: Self-pay | Admitting: *Deleted

## 2024-03-12 DIAGNOSIS — Z1231 Encounter for screening mammogram for malignant neoplasm of breast: Secondary | ICD-10-CM

## 2024-03-15 DIAGNOSIS — H2513 Age-related nuclear cataract, bilateral: Secondary | ICD-10-CM | POA: Diagnosis not present

## 2024-03-15 DIAGNOSIS — H02884 Meibomian gland dysfunction left upper eyelid: Secondary | ICD-10-CM | POA: Diagnosis not present

## 2024-03-15 DIAGNOSIS — H02881 Meibomian gland dysfunction right upper eyelid: Secondary | ICD-10-CM | POA: Diagnosis not present

## 2024-04-11 ENCOUNTER — Ambulatory Visit
Admission: RE | Admit: 2024-04-11 | Discharge: 2024-04-11 | Disposition: A | Discharge status: Home / Self Care | Source: Ambulatory Visit | Attending: Family Medicine | Admitting: Family Medicine

## 2024-04-11 DIAGNOSIS — E2839 Other primary ovarian failure: Secondary | ICD-10-CM | POA: Diagnosis not present

## 2024-04-11 DIAGNOSIS — Z1231 Encounter for screening mammogram for malignant neoplasm of breast: Secondary | ICD-10-CM | POA: Diagnosis not present

## 2024-04-11 DIAGNOSIS — Z780 Asymptomatic menopausal state: Secondary | ICD-10-CM | POA: Diagnosis not present

## 2024-04-11 DIAGNOSIS — M810 Age-related osteoporosis without current pathological fracture: Secondary | ICD-10-CM | POA: Diagnosis not present

## 2024-04-18 IMAGING — DX DG KNEE 1-2V PORT*L*
2 series · 2 of 2 positions shown · non-contrast
Comparison: None.

CLINICAL DATA: Status post left knee total arthroplasty

EXAM:
PORTABLE LEFT KNEE - 1-2 VIEW

[knee ap]
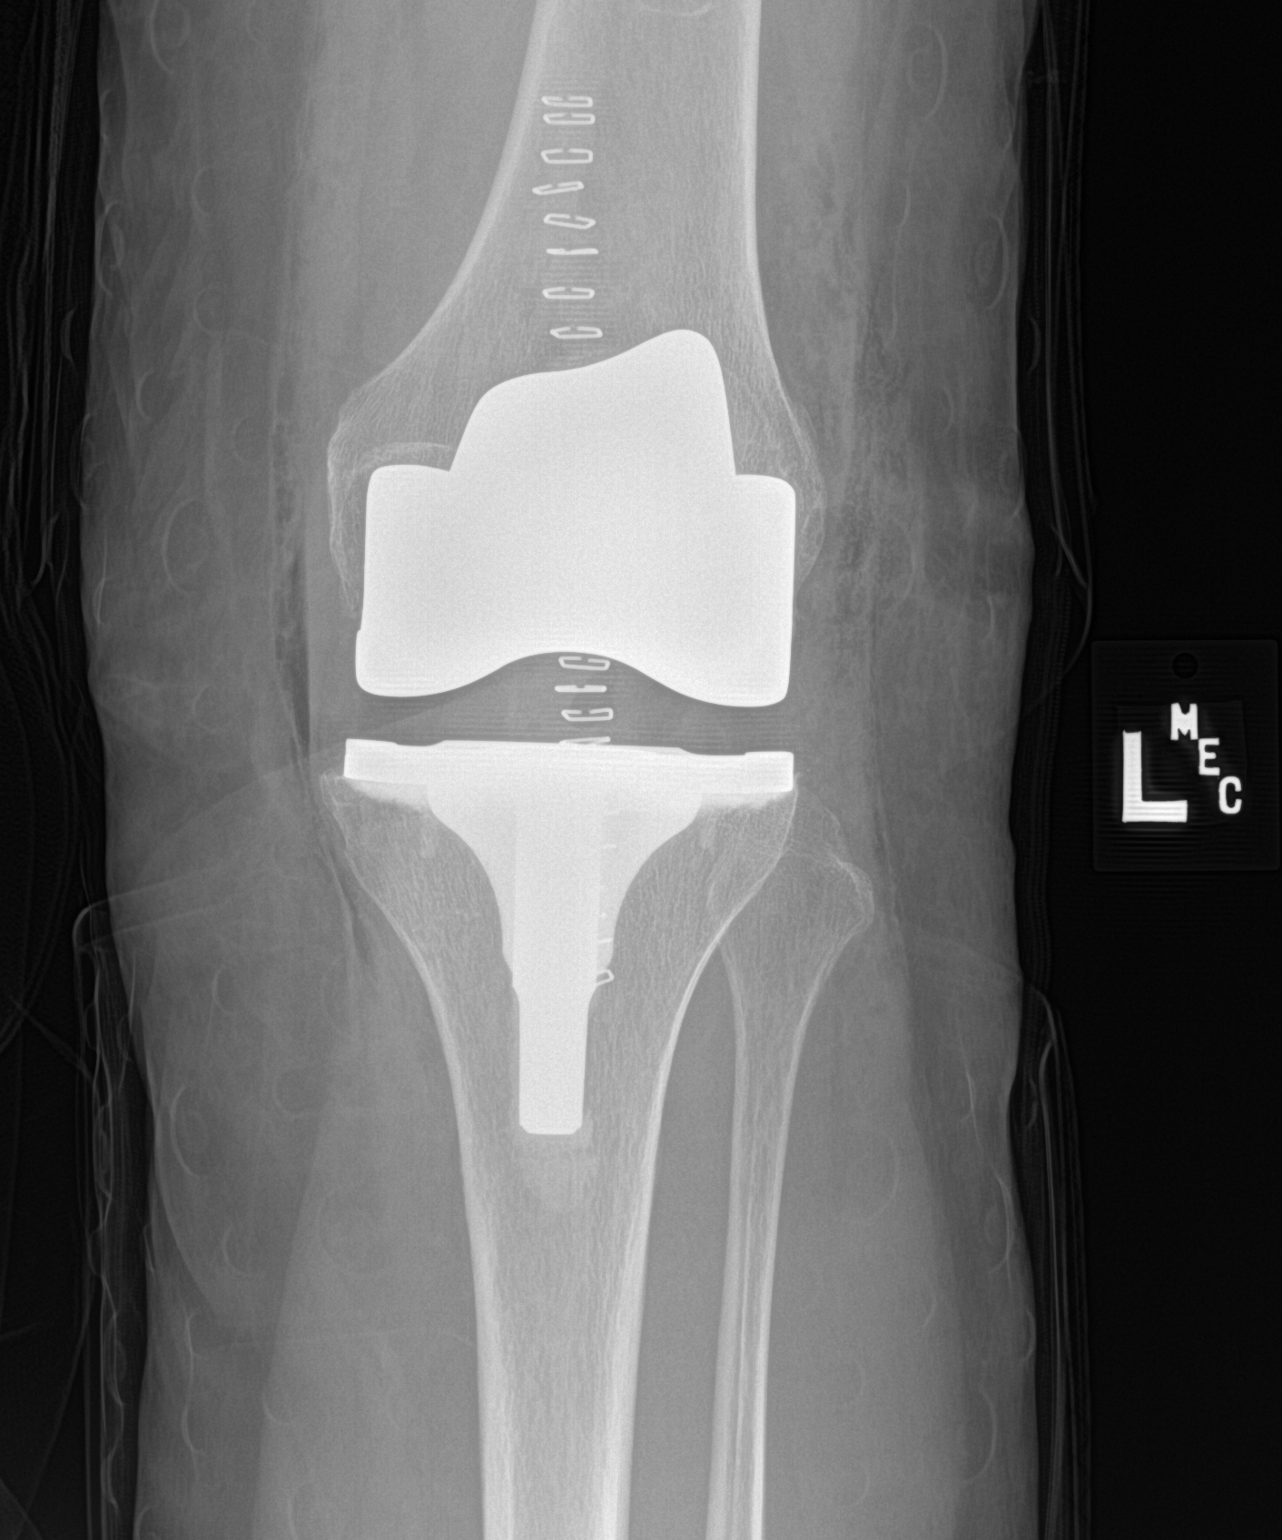

[knee lat]
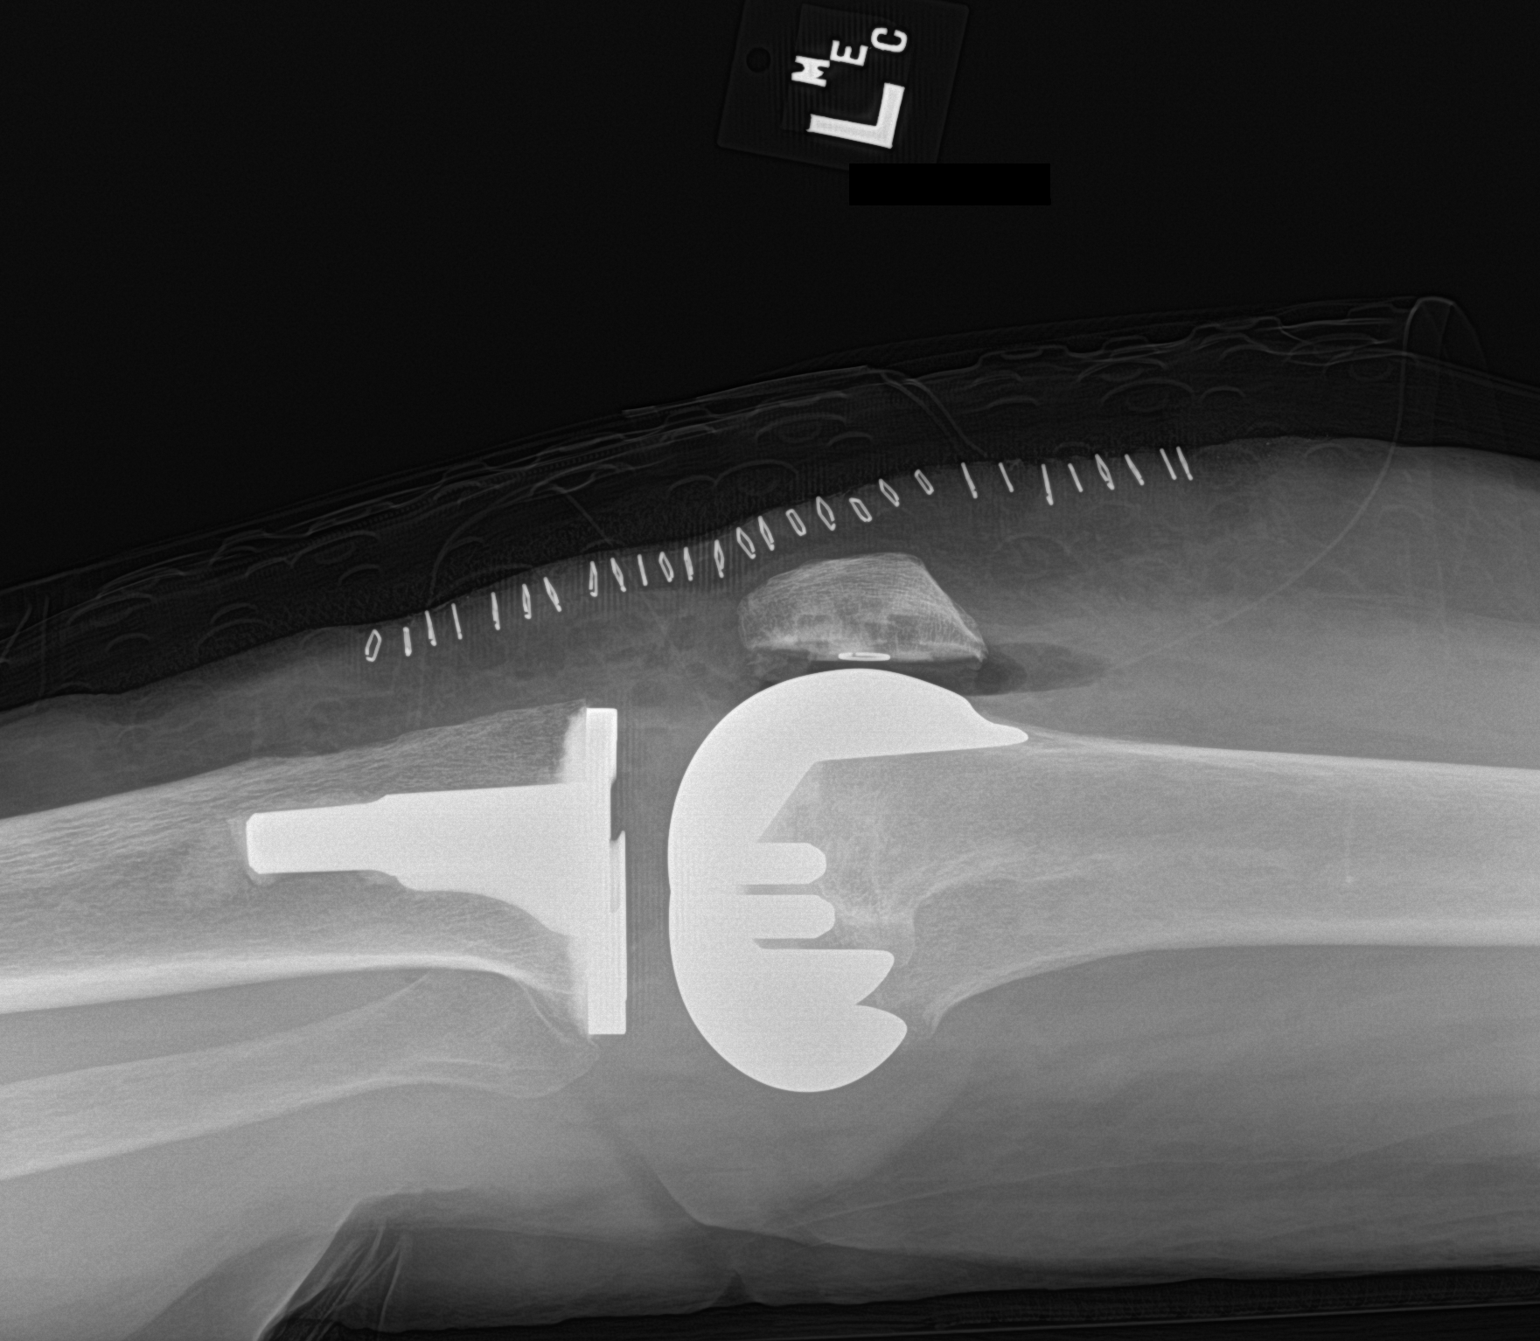

[2 of 2 positions shown; findings below may reference images not displayed]

FINDINGS: Status post left knee total arthroplasty with expected overlying
postoperative change. No evidence of perihardware fracture or
component malpositioning.
IMPRESSION: Status post left knee total arthroplasty with expected overlying
postoperative change. No evidence of perihardware fracture or
component malpositioning.

## 2024-06-04 ENCOUNTER — Telehealth: Payer: Self-pay | Admitting: Urology

## 2024-06-04 DIAGNOSIS — N3281 Overactive bladder: Secondary | ICD-10-CM

## 2024-06-04 NOTE — Telephone Encounter (Signed)
 Patient called to reschedule upcoming appointment due to prior obligations. Patient would like to know if she can have a RX refill sent to pharmacy for Gemtesa  to last her until her 07/02/2024 appointment. Please advise.

## 2024-06-05 MED ORDER — GEMTESA 75 MG PO TABS: 75.0000 mg | ORAL_TABLET | Freq: Every day | ORAL | 1 refills | Status: DC

## 2024-06-05 NOTE — Telephone Encounter (Signed)
 1 month of gemtesa  sent in. Pt aware.

## 2024-06-11 ENCOUNTER — Ambulatory Visit: Payer: Self-pay | Admitting: Urology

## 2024-07-02 ENCOUNTER — Ambulatory Visit (INDEPENDENT_AMBULATORY_CARE_PROVIDER_SITE_OTHER): Admitting: Urology

## 2024-07-02 VITALS — BP 127/79 | HR 92 | Ht 65.0 in | Wt 126.0 lb

## 2024-07-02 DIAGNOSIS — N3281 Overactive bladder: Secondary | ICD-10-CM | POA: Diagnosis not present

## 2024-07-02 LAB — MICROSCOPIC EXAMINATION

## 2024-07-02 LAB — URINALYSIS, COMPLETE
Bilirubin, UA: NEGATIVE
Glucose, UA: NEGATIVE
Ketones, UA: NEGATIVE
Nitrite, UA: NEGATIVE
Protein,UA: NEGATIVE
RBC, UA: NEGATIVE
Specific Gravity, UA: 1.02 (ref 1.005–1.030)
Urobilinogen, Ur: 0.2 mg/dL (ref 0.2–1.0)
pH, UA: 6 (ref 5.0–7.5)

## 2024-07-02 MED ORDER — GEMTESA 75 MG PO TABS: 75.0000 mg | ORAL_TABLET | Freq: Every day | ORAL | 3 refills | Status: DC

## 2024-07-02 NOTE — Progress Notes (Signed)
 07/02/2024 1:51 PM   Christie Lin 1951/07/09 [id number]  Referring provider: Randeen Laine LABOR, MD [address] [place],  [place] [address]  Chief Complaint  Patient presents with   Follow-up   Over Active Bladder    HPI: ST: Mixed incontinence and frequency.  She had a bladder cystotomy during a C-section in 1989 and a bladder suspension at the time of the repair.  Vesicare  helped symptoms mildly.  Postvoid residual 0 mL.  Was given Gemtesa .   Today Prior to taking medication patient has urge incontinence.  Sometimes she leaks with laughing sneezing.  No bedwetting.  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  She was wearing up to 3 lighter pads but now 1 or 2 thicker pads per day   On average she was getting up 4 times at night and voiding every hour during the day and cannot hold it for 2 hours.  She often has a slower flow and can void with a double voiding pattern   Gets 2-3 bladder infections a year.  Has not had a hysterectomy   She is now 80 to 90% better on Gemtesa  and very pleased.    Has mixed incontinence but primarily overactive bladder. She is doing so well on Gemtesa  I felt that we did not need to investigate further. No blood in urine. Reassurance given. Gemtesa  90 x 3 sent to pharmacy and I will see 1 year  Today Frequency stable Excellent bladder control.  No infection   PMH: Past Medical History:  Diagnosis Date   Anemia    Blood transfusion without reported diagnosis    DDD (degenerative disc disease), lumbar 06/20/2017   L3-4   Depression    grief reaction   Endometriosis    Fall    fell from broken sidewalk   Hypotension    per pt she has fainted from low b/p in the past   Low back pain    Palpitations 2022   no medications   Scoliosis     Surgical History: Past Surgical History:  Procedure Laterality Date   ABLATION ON ENDOMETRIOSIS     BALLOON DILATION  03/15/2023   Procedure: BALLOON DILATION;  Surgeon: Jinny Carmine, MD;   Location: [facility] ENDOSCOPY;  Service: Endoscopy;;   CESAREAN SECTION     COLONOSCOPY     COLONOSCOPY WITH PROPOFOL  N/A 03/15/2023   Procedure: COLONOSCOPY WITH PROPOFOL ;  Surgeon: Jinny Carmine, MD;  Location: [facility] ENDOSCOPY;  Service: Endoscopy;  Laterality: N/A;   ESOPHAGOGASTRODUODENOSCOPY (EGD) WITH PROPOFOL  N/A 03/15/2023   Procedure: ESOPHAGOGASTRODUODENOSCOPY (EGD) WITH PROPOFOL ;  Surgeon: Jinny Carmine, MD;  Location: [facility] ENDOSCOPY;  Service: Endoscopy;  Laterality: N/A;   POLYPECTOMY  03/15/2023   Procedure: POLYPECTOMY;  Surgeon: Jinny Carmine, MD;  Location: [facility] ENDOSCOPY;  Service: Endoscopy;;   ruptured ovarian cyst     TOTAL KNEE ARTHROPLASTY Left 01/20/2022   Procedure: TOTAL KNEE ARTHROPLASTY;  Surgeon: Leora Lynwood SAUNDERS, MD;  Location: [facility] ORS;  Service: Orthopedics;  Laterality: Left;   TUBAL LIGATION     WISDOM TOOTH EXTRACTION      Home Medications:  Allergies as of 07/02/2024       Reactions   Hydrocodone -acetaminophen  Other (See Comments)   Drop BP   Penicillins Swelling, Rash   Throat swelling, rash        Medication List        Accurate as of July 02, 2024  1:51 PM. If you have any questions, ask your  nurse or doctor.          azithromycin  250 MG tablet Commonly known as: ZITHROMAX  Take two tablets on day one followed by one tablet on days 2-5   benzonatate  100 MG capsule Commonly known as: TESSALON  Take 1 capsule (100 mg total) by mouth 3 (three) times daily as needed for cough.   citalopram  20 MG tablet Commonly known as: CELEXA  TAKE 1 TABLET BY MOUTH EVERY OTHER DAY.   cyclobenzaprine  10 MG tablet Commonly known as: FLEXERIL  TAKE ONE TABLET BY MOUTH 3 TIMES DAILY AS NEEDED FOR MUSCLE SPASM. THIS CAN MAKE YOU SLEEPY   Gemtesa  75 MG Tabs Generic drug: Vibegron  Take 1 tablet (75 mg total) by mouth daily.   MELATONIN PO Take 1 tablet by mouth at bedtime as needed (sleep).   midodrine  10 MG tablet Commonly known as: PROAMATINE  Take 1  tablet (10 mg total) by mouth 3 (three) times daily as needed.        Allergies:  Allergies  Allergen Reactions   Hydrocodone -Acetaminophen  Other (See Comments)    Drop BP   Penicillins Swelling and Rash    Throat swelling, rash    Family History: Family History  Problem Relation Age of Onset   Depression Sister    Cancer Maternal Grandmother        ovarian   Breast cancer Other    Colon cancer Neg Hx    Esophageal cancer Neg Hx    Pancreatic cancer Neg Hx    Rectal cancer Neg Hx    Stomach cancer Neg Hx     Social History:  reports that she has never smoked. She has never used smokeless tobacco. She reports current alcohol use. She reports that she does not use drugs.  ROS:                                        Physical Exam: There were no vitals taken for this visit.  Constitutional:  Alert and oriented, No acute distress.   Laboratory Data: Lab Results  Component Value Date   WBC 4.5 02/17/2024   HGB 13.5 02/17/2024   HCT 40.1 02/17/2024   MCV 91.2 02/17/2024   PLT 212.0 02/17/2024    Lab Results  Component Value Date   CREATININE 0.78 02/17/2024    No results found for: PSA  No results found for: TESTOSTERONE  No results found for: HGBA1C  Urinalysis    Component Value Date/Time   COLORURINE YELLOW (A) 01/14/2022 1525   APPEARANCEUR Clear 06/13/2023 0847   LABSPEC 1.009 01/14/2022 1525   PHURINE 5.0 01/14/2022 1525   GLUCOSEU Negative 06/13/2023 0847   HGBUR NEGATIVE 01/14/2022 1525   BILIRUBINUR Negative 06/13/2023 0847   KETONESUR NEGATIVE 01/14/2022 1525   PROTEINUR Negative 06/13/2023 0847   PROTEINUR NEGATIVE 01/14/2022 1525   UROBILINOGEN 0.2 03/09/2023 1141   NITRITE Negative 06/13/2023 0847   NITRITE NEGATIVE 01/14/2022 1525   LEUKOCYTESUR 1+ (A) 06/13/2023 0847   LEUKOCYTESUR NEGATIVE 01/14/2022 1525    Pertinent Imaging:   Assessment & Plan: Gemtesa  90 x 3 sent to pharmacy and I will see in  1 year  1. OAB (overactive bladder) (Primary)  - Urinalysis, Complete   No follow-ups on file.  Glendia DELENA Elizabeth, MD  [facility] [address], [address] [place], [place] [address] [phone]

## 2024-07-29 ENCOUNTER — Other Ambulatory Visit: Payer: Self-pay | Admitting: Family Medicine

## 2024-07-29 ENCOUNTER — Telehealth: Payer: Self-pay | Admitting: Family Medicine

## 2024-07-29 DIAGNOSIS — F4320 Adjustment disorder, unspecified: Secondary | ICD-10-CM

## 2024-07-29 MED ORDER — ALPRAZOLAM 0.25 MG PO TABS: 0.2500 mg | ORAL_TABLET | Freq: Two times a day (BID) | ORAL | 0 refills | Status: AC | PRN

## 2024-07-29 NOTE — Telephone Encounter (Signed)
 Pt husband is in hospice----  most likely will pass away tonight.  Pt is very emotional.   She is asking for something to help her get through the funeral.  Xanax 0.25mg  #20 sent to pharmacy.

## 2024-08-09 ENCOUNTER — Encounter: Payer: Self-pay | Admitting: Family Medicine

## 2024-08-10 MED ORDER — MIRTAZAPINE 15 MG PO TABS: 15.0000 mg | ORAL_TABLET | Freq: Every day | ORAL | 1 refills | Status: AC

## 2024-10-12 ENCOUNTER — Encounter: Payer: Self-pay | Admitting: Urology

## 2024-10-12 DIAGNOSIS — N3281 Overactive bladder: Secondary | ICD-10-CM

## 2024-10-12 DIAGNOSIS — N3941 Urge incontinence: Secondary | ICD-10-CM

## 2024-10-12 DIAGNOSIS — N393 Stress incontinence (female) (male): Secondary | ICD-10-CM

## 2024-10-15 MED ORDER — MIRABEGRON ER 50 MG PO TB24: 50.0000 mg | ORAL_TABLET | Freq: Every day | ORAL | 11 refills | Status: AC

## 2024-11-14 ENCOUNTER — Ambulatory Visit: Payer: PPO

## 2025-07-01 ENCOUNTER — Ambulatory Visit: Admitting: Urology
